# Patient Record
Sex: Male | Born: 1970 | Race: White | Hispanic: No | Marital: Married | Smoking: Former smoker
Health system: Southern US, Community
[De-identification: ages and names within clinical notes are randomized; demographics above are authoritative.]

## PROBLEM LIST (undated history)

## (undated) DIAGNOSIS — M1990 Unspecified osteoarthritis, unspecified site: Secondary | ICD-10-CM

## (undated) DIAGNOSIS — Z789 Other specified health status: Secondary | ICD-10-CM

## (undated) HISTORY — PX: PILONIDAL CYST EXCISION: SHX744

---

## 2000-03-28 HISTORY — PX: KNEE ARTHROSCOPY: SHX127

## 2000-10-25 ENCOUNTER — Ambulatory Visit (HOSPITAL_BASED_OUTPATIENT_CLINIC_OR_DEPARTMENT_OTHER): Admission: RE | Admit: 2000-10-25 | Discharge: 2000-10-25 | Payer: Self-pay | Admitting: Orthopedic Surgery

## 2002-03-28 HISTORY — PX: KNEE ARTHROSCOPY: SHX127

## 2005-03-28 HISTORY — PX: ARTHROSCOPIC REPAIR ACL: SUR80

## 2005-12-30 ENCOUNTER — Ambulatory Visit (HOSPITAL_BASED_OUTPATIENT_CLINIC_OR_DEPARTMENT_OTHER): Admission: RE | Admit: 2005-12-30 | Discharge: 2005-12-30 | Payer: Self-pay | Admitting: Orthopedic Surgery

## 2008-03-28 HISTORY — PX: KNEE ARTHROSCOPY W/ ACL RECONSTRUCTION: SHX1858

## 2008-04-23 ENCOUNTER — Ambulatory Visit (HOSPITAL_BASED_OUTPATIENT_CLINIC_OR_DEPARTMENT_OTHER): Admission: RE | Admit: 2008-04-23 | Discharge: 2008-04-23 | Payer: Self-pay | Admitting: Orthopedic Surgery

## 2010-07-12 LAB — POCT HEMOGLOBIN-HEMACUE: Hemoglobin: 15.5 g/dL (ref 13.0–17.0)

## 2010-08-10 NOTE — Op Note (Signed)
NAME:  Zachary Finley, Zachary Finley NO.:  [id number]   MEDICAL RECORD NO.:  [id number]          PATIENT TYPE:  AMB   LOCATION:  DSC                          FACILITY:  [facility]   PHYSICIAN:  Harvie Junior, M.D.   DATE OF BIRTH:  01/22/1971   DATE OF PROCEDURE:  04/23/2008  DATE OF DISCHARGE:                               OPERATIVE REPORT   PREOPERATIVE DIAGNOSES:  Anterior cruciate ligament tear, left with  chondromalacia of patella and suspected medial meniscal tear.   POSTOPERATIVE DIAGNOSES:  1. Anterior cruciate ligament tear.  2. Chondromalacia of patella, in particular patellofemoral trochlea.   PERTINENT PROCEDURE:  1. Anterior cruciate ligament reconstruction with central one-third      patellar tendon allograft.  2. Debridement of chondromalacia patella and patellofemoral trochlea      down to bleeding bone.   SURGEON:  Harvie Junior, MD   ASSISTANT:  Marshia Ly, PA   ANESTHESIA:  General.   BRIEF HISTORY:  Mr. Reihl is a 40 year old male with a long history of  having had a previous right ACL tear who had a twisting injury to his  left knee and unfortunately suffered a left ACL tear.  MRI showed that  he had a high grade ACL tear with suspected medial meniscal tear and a  questionable chondromalacia patella.  We talked about treatment options  and ultimately felt that the ACL reconstruction is the most appropriate  course of action.  He was brought to the operating room for this  procedure.   PROCEDURE:  The patient was brought to the operating room.  After  adequate anesthesia was obtained with general anesthetic, the patient  was placed supine on the operating table.  The left leg was prepped and  draped in usual sterile fashion.  Following this, the routine  arthroscopic examination of the knee revealed there was significant  chondromalacia of the patellofemoral trochlea.  This was debrided back  to a smooth and stable rim with a shaver.  We had to  do this from both  the medial and lateral compartment and this did go down to the bone.  The patella tracked midline and had no significant hyperpressure.  Attention was turned to the medial compartment.  The medial meniscus was  probed at length and felt to be stable.  Medial femoral condyle was  probed.  There was a little bit of softening of the cartilage but no  breaches in the cartilage, so we left this.  Attention was turned  lateral, the lateral femoral condyle and lateral meniscus were normal.  There was this big cyclops lesion laterally, which we debrided and the  ACL was actually oriented, but only probed back behind and they did not  go to the back wall at all.  We tensioned it, would not come under  tension.  At this point, the remaining stump of ACL was removed and the  notchplasty was performed.  Following this, attention was turned towards  putting the Arthrex femoral guide back hugging the PCL and we made a  small incision just distal to the  medial portal and then drilled a wire  into the central portion of this guide over-reamed to 10, 6.5 mm over  the top guide was then used in the femoral position and made a pilot  drill to Beath needle out the distal lateral femur, made a pilot hole  with a 9, made sure it was a hole and not a ditch out the back.  Once we  confirmed this, we drilled it to a level of 22 mm, which was the length  of the femoral side bone plug.  A notch was placed, removed all the bone  out of the knee at this point.  Bone fragments irrigated thoroughly.  We  did advance the graft into place, locked it inside on the femoral side  by 7 x 20 screw with a sheath to protect the graft, went to the tibial  side and locked in with a 7 x 25 screw.  No sheath, but got excellent  fixation, checked the graft at that point, perfect way that it kind of  came around the PCL and hugging the PCL and easy full extension.  We  looked at the patellofemoral trochlea, which  looked good.  No additional  areas of flaps or problems.  The medial side and lateral side were again  inspected to make sure that there was no loose pieces of fragment pieces  of bone.  At this point, the knee was copiously and thoroughly irrigated  and suctioned dry.  All sterile portals were closed with a bandage.  The  small incision for the ACL was closed with an 0 Vicryl interrupted and 3-  0 Monocryl subcuticular.  Benzoin and Steri-Strips were applied here.  A  sterile compressive dressing was applied as well as the knee  immobilizer.  The patient was taken to the recovery room and was noted  to be in satisfactory condition.  Estimated blood loss for the procedure  was none.      Harvie Junior, M.D.  Electronically Signed     JLG/MEDQ  D:  04/23/2008  T:  04/24/2008  Job:  [id number]

## 2010-08-13 NOTE — Op Note (Signed)
NAME:  Zachary Finley, Zachary Finley NO.:  [id number]   MEDICAL RECORD NO.:  [id number]          PATIENT TYPE:  AMB   LOCATION:  DSC                          FACILITY:  [facility]   PHYSICIAN:  Harvie Junior, M.D.   DATE OF BIRTH:  04-16-1970   DATE OF PROCEDURE:  12/30/2005  DATE OF DISCHARGE:                                 OPERATIVE REPORT   PREOPERATIVE DIAGNOSES:  1. Anterior cruciate ligament tear.  2. Posterior-horn medial meniscal tear.   POSTOPERATIVE DIAGNOSES:  1. Anterior cruciate ligament tear.  2. Posterior-horn medial meniscal tear.   PRINCIPLE PROCEDURES:  1. Anterior cruciate ligament reconstruction with central 1/3 patellar      tendon allograft.  2. Debridement of posterior-horn medial meniscus bucket-handle style tear.   SURGEON:  Harvie Junior, M.D.   ASSISTANT:  Marshia Ly, P.A.   ANESTHESIA:  General.   BRIEF HISTORY:  The patient is a 40 year old male with a long history of  having a twisting-type injury to his knee.  He ultimately began feeling  unstable once this had happened, and MRI was obtained, which showed that he  had an ACL tear, as well as a posterior horn medial meniscal tear.  We were  consulted for his treatment.  We talked about treatment options and we  ultimately felt that reconstruction was the most appropriate course of  action, given the unstable feeling that he was having.  At this point, the  patient was taken to the operating room for fixation of these problems.   PROCEDURES:  The patient was taken to the operating room, and after adequate  anesthesia was obtained with a general anesthetic, the patient was placed on  the operating table and the right leg was then prepped and draped in the  usual sterile fashion.  The knee was examined under anesthesia prior to  prepping and draping, and at this point, it was obviously Lachman positive  and obviously pivot positive.  At that point, the leg was prepped and draped  in the  usual sterile fashion and placed into a leg holder.  At that point,  the arthroscope examination revealed that there was lateral tracking of the  patella, but certainly not dramatic, and it did recover as we came into  about 60 degrees of flexion.  We came into the medial side.  There was a big  wad of medial scar tissue, which was identified, and this was debrided.  Attention turned to the medial compartment, where the medial meniscus had a  complex posterior-horn tear with an anterior flap and a posterior flap,  which had balled up back in the posteromedial area, which was seen very well  by MRI.  This was debrided with a straight-biting forceps, an upbiting  forceps, and the remaining meniscal rim was contoured down with a suction  shaver.  A probe was used to make sure the meniscus was stable at this  point.  Attention at this time was turned into the notch, where,  interestingly, the anterior cruciate ligament looked to be oriented and  going in the appropriate  direction.  With a probe and no other teasing, it  was clear that the anterior cruciate did not go anywhere near its insertion  site and the lateral wall was completely empty.  At this point, the remnant  of the anterior cruciate was debrided and this was removed.  A fairly  aggressive notchplasty was undertaken to dig back out the area, where the  ACL had been torn off.  Once this was accomplished, the attention was turned  towards the ACL reconstruction.  The guide was placed just 7 mm anterior to  the posterior cruciate ligament, and the small incision was made just  inferior to the medial portal.  A guide wire was then placed into the full  footprint of the ACL, and this was overreamed with a 10-mm reamer.  The back  portion of the tunnel was then rasped, and a 6.5-mm over-the-top guide was  used and a beef needle was advanced out the distal lateral femur.  At this  point, this hole was overreamed with a 9 reamer.  Initially,  a footprint was  made to make sure there was an adequate back wall; and seeing that,  attention was then turned to reaming deeper to a level of 25 mm.  At this  point, the reamer was removed and the tunnel was notched for a screw.  At  this point, all excess bone fragments and remnants were removed from the  knee by milking the knee out the tibial hole, as well as the suction shaver.  At this point, the graft was advanced in the knee, locked in place, locked  on the femoral side with a 7 x 20 screw.  At this point, attention was  turned towards the tibial side, and the knee was cycled through 20 ranges  with no tendency towards instability of the graft, and the tibial side was  then locked in place with a 7 x 20 screw with no sheath.  Excellent fixation  was achieved.  There was a couple millimeters of bone sticking out of the  tunnel distally, and this was nibbled with a rongeur.  Once this was  completed, attention was turned towards checking the graft in the knee.  It  felt great, and in extension, the knee easily came out into full extension.  No block to full extension, and once this was checked, the leg could easily  be put into full flexion.  No medial or lateral instability.  We took a look  medially and laterally.  There were no bone fragments or fragmenting pieces  of meniscus or cartilage.  At this point, the knee was copiously irrigated  and suctioned dry.  The portals were closed with a bandage.  A small  incision was made for the tibial tunnel.  It was closed with some 2-0 Vicryl  and a 3-0 Maxon pull-out suture.  Benzoin and Steri-Strips were applied.  The patient was taken to the recovery room, where he was noted to be in  satisfactory condition.  Estimated blood loss for the procedure was  negligible.      Harvie Junior, M.D.  Electronically Signed     JLG/MEDQ  D:  12/30/2005  T:  12/31/2005  Job:  [id number]

## 2010-08-13 NOTE — Op Note (Signed)
[place]  Patient:    LONNY, EISEN                      MRN: [id number] Proc. Date: 10/25/00 Adm. Date:  [id number] Attending:  Milly Jakob                           Operative Report  PREOPERATIVE DIAGNOSIS:  Knee pain suspected plica versus meniscal tear.  POSTOPERATIVE DIAGNOSIS:  Medial shelf plica.  PROCEDURE:  Debridement of medial shelf plica.  SURGEON:  Harvie Junior, M.D.  ANESTHESIA:  General.  BRIEF HISTORY:  This is a 40 year old male with a long history of having left knee pain.  He has had multiple episodes of popping and catching in the knee. He had been evaluated in the office and had undergone conservative care and had failed this and because of failure of conservative care and because of continue catching and locking of the knee the patient was taken to the operating room for evaluation, anesthesia and arthroscopy.  PROCEDURE:  The patient was taken to the operating room and after adequate anesthesia was obtained with general anesthetic the patient was placed supine on the operating room table.  The left leg was then prepped and draped in the usual sterile fashion.  Following this routine arthroscopic examination revealed there was some lateral patellar tilt although the patella did centralize and the knee came down to full flexion.  The cannula could be easily moved under the patella.  Attention was then turned to the medial side where there was noted to be a fairly dramatic medial shelf plica which blocked entrance into the medial compartment.  This was popping back and forth over the medial femoral condyle and there was an area of roughness over the medial femoral condyle.  The medial shelf plica was then debrided to allow access into the medial compartment.  This was a fairly significant procedure as the plica was quite tenacious, and multiple biters and shavers had to be used to remove the medial shelf  plica.  Attention was then turned to the medial compartment where there was a ______ normalcy in the medial meniscus and no evidence of instability of that meniscus.  Attention was then turned to the medial femoral condyle where there was no evidence of chondromalacia.  Attention was then turned to anterior cruciate which normal lateral side was normal.  I returned back to the patellofemoral joint which showed no evidence of chondromalacia.  A final check was made and it was now easy to come into the medial compartment with no evidence of soft tissue impinging on the medial femoral condyle.  At this time the knee was copiously irrigated and suctioned dry.  The arthroscopic portals were closed with bandage, 20 cc. of 0.25% Marcaine was instilled in the knee for postoperative anesthesia.  Sterile compressive dressing was applied and the patient was taken to the recovery room where she was noted to be in satisfactory condition. DD:  10/25/00 TD:  10/25/00 Job: [id number]

## 2011-11-10 ENCOUNTER — Other Ambulatory Visit: Payer: Self-pay | Admitting: Orthopedic Surgery

## 2011-11-25 ENCOUNTER — Ambulatory Visit (HOSPITAL_BASED_OUTPATIENT_CLINIC_OR_DEPARTMENT_OTHER)
Admission: RE | Admit: 2011-11-25 | Payer: BC Managed Care – PPO | Source: Ambulatory Visit | Admitting: Orthopedic Surgery

## 2011-11-25 ENCOUNTER — Encounter (HOSPITAL_BASED_OUTPATIENT_CLINIC_OR_DEPARTMENT_OTHER): Admission: RE | Payer: Self-pay | Source: Ambulatory Visit

## 2011-11-25 SURGERY — KNEE ARTHROSCOPY WITH ANTERIOR CRUCIATE LIGAMENT (ACL) REPAIR
Anesthesia: General | Laterality: Right

## 2011-12-06 ENCOUNTER — Encounter (HOSPITAL_BASED_OUTPATIENT_CLINIC_OR_DEPARTMENT_OTHER): Payer: Self-pay | Admitting: *Deleted

## 2011-12-06 NOTE — Progress Notes (Signed)
This will be his 5th knee surgery To bring crutches No labs needed

## 2011-12-09 ENCOUNTER — Ambulatory Visit (HOSPITAL_BASED_OUTPATIENT_CLINIC_OR_DEPARTMENT_OTHER)
Admission: RE | Admit: 2011-12-09 | Discharge: 2011-12-09 | Disposition: A | Discharge status: Home / Self Care | Payer: BC Managed Care – PPO | Source: Ambulatory Visit | Attending: Orthopedic Surgery | Admitting: Orthopedic Surgery

## 2011-12-09 ENCOUNTER — Encounter (HOSPITAL_BASED_OUTPATIENT_CLINIC_OR_DEPARTMENT_OTHER): Payer: Self-pay | Admitting: *Deleted

## 2011-12-09 ENCOUNTER — Encounter (HOSPITAL_BASED_OUTPATIENT_CLINIC_OR_DEPARTMENT_OTHER): Payer: Self-pay | Admitting: Certified Registered"

## 2011-12-09 ENCOUNTER — Ambulatory Visit (HOSPITAL_BASED_OUTPATIENT_CLINIC_OR_DEPARTMENT_OTHER): Payer: BC Managed Care – PPO | Admitting: Anesthesiology

## 2011-12-09 ENCOUNTER — Encounter (HOSPITAL_BASED_OUTPATIENT_CLINIC_OR_DEPARTMENT_OTHER): Payer: Self-pay | Admitting: Anesthesiology

## 2011-12-09 ENCOUNTER — Encounter (HOSPITAL_BASED_OUTPATIENT_CLINIC_OR_DEPARTMENT_OTHER)
Admission: RE | Disposition: A | Discharge status: Home / Self Care | Payer: Self-pay | Source: Ambulatory Visit | Attending: Orthopedic Surgery

## 2011-12-09 DIAGNOSIS — S83509A Sprain of unspecified cruciate ligament of unspecified knee, initial encounter: Secondary | ICD-10-CM | POA: Insufficient documentation

## 2011-12-09 DIAGNOSIS — M2340 Loose body in knee, unspecified knee: Secondary | ICD-10-CM | POA: Insufficient documentation

## 2011-12-09 DIAGNOSIS — X500XXA Overexertion from strenuous movement or load, initial encounter: Secondary | ICD-10-CM | POA: Insufficient documentation

## 2011-12-09 DIAGNOSIS — M1990 Unspecified osteoarthritis, unspecified site: Secondary | ICD-10-CM | POA: Insufficient documentation

## 2011-12-09 DIAGNOSIS — IMO0002 Reserved for concepts with insufficient information to code with codable children: Secondary | ICD-10-CM | POA: Insufficient documentation

## 2011-12-09 DIAGNOSIS — Z472 Encounter for removal of internal fixation device: Secondary | ICD-10-CM | POA: Insufficient documentation

## 2011-12-09 DIAGNOSIS — M2240 Chondromalacia patellae, unspecified knee: Secondary | ICD-10-CM | POA: Insufficient documentation

## 2011-12-09 DIAGNOSIS — M2350 Chronic instability of knee, unspecified knee: Secondary | ICD-10-CM | POA: Insufficient documentation

## 2011-12-09 HISTORY — DX: Other specified health status: Z78.9

## 2011-12-09 HISTORY — DX: Unspecified osteoarthritis, unspecified site: M19.90

## 2011-12-09 SURGERY — KNEE ARTHROSCOPY WITH ANTERIOR CRUCIATE LIGAMENT (ACL) REPAIR
Anesthesia: General | Site: Knee | Laterality: Right | Wound class: Clean

## 2011-12-09 MED ORDER — CEFAZOLIN SODIUM-DEXTROSE 2-3 GM-% IV SOLR
2.0000 g | Freq: Once | INTRAVENOUS | Status: AC
  Administered 2011-12-09: 2 g via INTRAVENOUS

## 2011-12-09 MED ORDER — MIDAZOLAM HCL 2 MG/2ML IJ SOLN
0.5000 mg | INTRAMUSCULAR | Status: DC | PRN
  Administered 2011-12-09: 2 mg via INTRAVENOUS

## 2011-12-09 MED ORDER — POVIDONE-IODINE 7.5 % EX SOLN
Freq: Once | CUTANEOUS | Status: AC
  Administered 2011-12-09: 12:00:00 via TOPICAL

## 2011-12-09 MED ORDER — FENTANYL CITRATE 0.05 MG/ML IJ SOLN
50.0000 ug | INTRAMUSCULAR | Status: DC | PRN
  Administered 2011-12-09: 100 ug via INTRAVENOUS

## 2011-12-09 MED ORDER — BUPIVACAINE-EPINEPHRINE PF 0.5-1:200000 % IJ SOLN
INTRAMUSCULAR | Status: DC | PRN
  Administered 2011-12-09: 30 mL

## 2011-12-09 MED ORDER — FENTANYL CITRATE 0.05 MG/ML IJ SOLN
INTRAMUSCULAR | Status: DC | PRN
  Administered 2011-12-09 (×4): 25 ug via INTRAVENOUS

## 2011-12-09 MED ORDER — LACTATED RINGERS IV SOLN
INTRAVENOUS | Status: DC
  Administered 2011-12-09 (×2): via INTRAVENOUS

## 2011-12-09 MED ORDER — DEXTROSE 5 % IV SOLN
3.0000 g | INTRAVENOUS | Status: AC
  Administered 2011-12-09: 3 g via INTRAVENOUS

## 2011-12-09 MED ORDER — OXYCODONE HCL 5 MG/5ML PO SOLN: 5.0000 mg | Freq: Once | ORAL | Status: AC | PRN

## 2011-12-09 MED ORDER — ACETAMINOPHEN 10 MG/ML IV SOLN
1000.0000 mg | Freq: Once | INTRAVENOUS | Status: AC
  Administered 2011-12-09: 1000 mg via INTRAVENOUS

## 2011-12-09 MED ORDER — METOCLOPRAMIDE HCL 5 MG/ML IJ SOLN
INTRAMUSCULAR | Status: DC | PRN
  Administered 2011-12-09: 10 mg via INTRAVENOUS

## 2011-12-09 MED ORDER — LIDOCAINE HCL (CARDIAC) 20 MG/ML IV SOLN
INTRAVENOUS | Status: DC | PRN
  Administered 2011-12-09: 50 mg via INTRAVENOUS

## 2011-12-09 MED ORDER — CEFAZOLIN SODIUM-DEXTROSE 2-3 GM-% IV SOLR: 2.0000 g | Freq: Once | INTRAVENOUS | Status: DC

## 2011-12-09 MED ORDER — OXYCODONE-ACETAMINOPHEN 5-325 MG PO TABS: 1.0000 | ORAL_TABLET | Freq: Four times a day (QID) | ORAL | Status: AC | PRN

## 2011-12-09 MED ORDER — OXYCODONE HCL 5 MG PO TABS
5.0000 mg | ORAL_TABLET | Freq: Once | ORAL | Status: AC | PRN
  Administered 2011-12-09: 5 mg via ORAL

## 2011-12-09 MED ORDER — DEXAMETHASONE SODIUM PHOSPHATE 4 MG/ML IJ SOLN
INTRAMUSCULAR | Status: DC | PRN
  Administered 2011-12-09: 10 mg via INTRAVENOUS

## 2011-12-09 MED ORDER — ONDANSETRON HCL 4 MG/2ML IJ SOLN
INTRAMUSCULAR | Status: DC | PRN
  Administered 2011-12-09: 4 mg via INTRAVENOUS

## 2011-12-09 MED ORDER — PROPOFOL 10 MG/ML IV BOLUS
INTRAVENOUS | Status: DC | PRN
  Administered 2011-12-09: 200 mg via INTRAVENOUS

## 2011-12-09 MED ORDER — HYDROMORPHONE HCL PF 1 MG/ML IJ SOLN
0.2500 mg | INTRAMUSCULAR | Status: DC | PRN
  Administered 2011-12-09 (×4): 0.5 mg via INTRAVENOUS

## 2011-12-09 MED ORDER — CLINDAMYCIN PHOSPHATE 600 MG/50ML IV SOLN: 600.0000 mg | Freq: Once | INTRAVENOUS | Status: DC

## 2011-12-09 SURGICAL SUPPLY — 78 items
APL SKNCLS STERI-STRIP NONHPOA (GAUZE/BANDAGES/DRESSINGS) ×1
BANDAGE ESMARK 6X9 LF (GAUZE/BANDAGES/DRESSINGS) IMPLANT
BENZOIN TINCTURE PRP APPL 2/3 (GAUZE/BANDAGES/DRESSINGS) ×2 IMPLANT
BLADE 4.2CUDA (BLADE) IMPLANT
BLADE AVERAGE 25X9 (BLADE) ×2 IMPLANT
BLADE CUDA 5.5 (BLADE) IMPLANT
BLADE CUTTER GATOR 3.5 (BLADE) IMPLANT
BLADE GREAT WHITE 4.2 (BLADE) ×2 IMPLANT
BLADE OSCIL/SAGITTAL W/10 ST (BLADE) IMPLANT
BLADE SURG 15 STRL LF DISP TIS (BLADE) ×1 IMPLANT
BLADE SURG 15 STRL SS (BLADE) ×2
BNDG CMPR 9X6 STRL LF SNTH (GAUZE/BANDAGES/DRESSINGS) ×1
BNDG ESMARK 6X9 LF (GAUZE/BANDAGES/DRESSINGS) ×2
BUR OVAL 4.0 (BURR) ×2 IMPLANT
CANISTER OMNI JUG 16 LITER (MISCELLANEOUS) ×2 IMPLANT
CANISTER SUCTION 2500CC (MISCELLANEOUS) IMPLANT
CLOTH BEACON ORANGE TIMEOUT ST (SAFETY) ×2 IMPLANT
COVER TABLE BACK 60X90 (DRAPES) ×2 IMPLANT
DRAPE ARTHROSCOPY W/POUCH 114 (DRAPES) ×2 IMPLANT
DRSG EMULSION OIL 3X3 NADH (GAUZE/BANDAGES/DRESSINGS) ×2 IMPLANT
DURAPREP 26ML APPLICATOR (WOUND CARE) ×2 IMPLANT
ELECT MENISCUS 165MM 90D (ELECTRODE) IMPLANT
ELECT REM PT RETURN 9FT ADLT (ELECTROSURGICAL) ×2
ELECTRODE REM PT RTRN 9FT ADLT (ELECTROSURGICAL) IMPLANT
GLOVE BIO SURGEON STRL SZ 6.5 (GLOVE) ×1 IMPLANT
GLOVE BIOGEL PI IND STRL 7.0 (GLOVE) IMPLANT
GLOVE BIOGEL PI IND STRL 8 (GLOVE) ×2 IMPLANT
GLOVE BIOGEL PI INDICATOR 7.0 (GLOVE) ×1
GLOVE BIOGEL PI INDICATOR 8 (GLOVE) ×2
GLOVE ECLIPSE 7.5 STRL STRAW (GLOVE) ×4 IMPLANT
GOWN BRE IMP PREV XXLGXLNG (GOWN DISPOSABLE) ×2 IMPLANT
GOWN PREVENTION PLUS XLARGE (GOWN DISPOSABLE) ×2 IMPLANT
GOWN PREVENTION PLUS XXLARGE (GOWN DISPOSABLE) ×2 IMPLANT
GRAFT TISS PATELLAR TNDN 10 (Tissue) IMPLANT
HOLDER KNEE FOAM BLUE (MISCELLANEOUS) ×2 IMPLANT
IMMOBILIZER KNEE 22 UNIV (SOFTGOODS) IMPLANT
IMMOBILIZER KNEE 24 THIGH 36 (MISCELLANEOUS) IMPLANT
IMMOBILIZER KNEE 24 UNIV (MISCELLANEOUS) ×2
KIT TRANSTIBIAL (DISPOSABLE) ×2 IMPLANT
KNEE WRAP E Z 3 GEL PACK (MISCELLANEOUS) ×2 IMPLANT
KNIFE GRAFT ACL 10MM 5952 (MISCELLANEOUS) IMPLANT
KNIFE GRAFT ACL 11MM (MISCELLANEOUS) IMPLANT
NDL FILTER BLUNT 18X1 1/2 (NEEDLE) ×1 IMPLANT
NDL SAFETY ECLIPSE 18X1.5 (NEEDLE) ×1 IMPLANT
NEEDLE FILTER BLUNT 18X 1/2SAF (NEEDLE)
NEEDLE FILTER BLUNT 18X1 1/2 (NEEDLE) IMPLANT
NEEDLE HYPO 18GX1.5 SHARP (NEEDLE)
NEEDLE MENISCAL REPAIR DBL ARM (NEEDLE) IMPLANT
PACK ARTHROSCOPY DSU (CUSTOM PROCEDURE TRAY) ×2 IMPLANT
PACK BASIN DAY SURGERY FS (CUSTOM PROCEDURE TRAY) ×2 IMPLANT
PAD CAST 4YDX4 CTTN HI CHSV (CAST SUPPLIES) ×2 IMPLANT
PADDING CAST COTTON 4X4 STRL (CAST SUPPLIES) ×4
PASSER SUT SWANSON 36MM LOOP (INSTRUMENTS) IMPLANT
PATELLA LIGAMENT BISECTED FR (Tissue) ×2 IMPLANT
PENCIL BUTTON HOLSTER BLD 10FT (ELECTRODE) ×1 IMPLANT
SCREW INTERFERENCE 7X20MM (Screw) ×1 IMPLANT
SCREW SHEATHED INTERF 8X20MM (Screw) ×1 IMPLANT
SET ARTHROSCOPY TUBING (MISCELLANEOUS) ×2
SET ARTHROSCOPY TUBING LN (MISCELLANEOUS) ×1 IMPLANT
SHEET MEDIUM DRAPE 40X70 STRL (DRAPES) ×2 IMPLANT
SPONGE GAUZE 4X4 12PLY (GAUZE/BANDAGES/DRESSINGS) ×2 IMPLANT
SPONGE LAP 4X18 X RAY DECT (DISPOSABLE) ×2 IMPLANT
STRIP CLOSURE SKIN 1/2X4 (GAUZE/BANDAGES/DRESSINGS) ×2 IMPLANT
SUCTION FRAZIER TIP 10 FR DISP (SUCTIONS) ×2 IMPLANT
SUT ETHILON 4 0 PS 2 18 (SUTURE) IMPLANT
SUT MNCRL AB 3-0 PS2 18 (SUTURE) ×2 IMPLANT
SUT PDS AB 1 CT  36 (SUTURE) ×2
SUT PDS AB 1 CT 36 (SUTURE) ×2 IMPLANT
SUT STEEL 5 (SUTURE) ×2 IMPLANT
SUT TICRON 1 T 12 (SUTURE) IMPLANT
SUT VIC AB 0 CT1 27 (SUTURE)
SUT VIC AB 0 CT1 27XBRD ANBCTR (SUTURE) IMPLANT
SUT VIC AB 2-0 SH 27 (SUTURE)
SUT VIC AB 2-0 SH 27XBRD (SUTURE) IMPLANT
SYR 5ML LL (SYRINGE) ×2 IMPLANT
TOWEL OR 17X24 6PK STRL BLUE (TOWEL DISPOSABLE) ×6 IMPLANT
TOWEL OR NON WOVEN STRL DISP B (DISPOSABLE) ×2 IMPLANT
WATER STERILE IRR 1000ML POUR (IV SOLUTION) ×2 IMPLANT

## 2011-12-09 NOTE — Brief Op Note (Signed)
12/09/2011  2:57 PM  PATIENT:  Zachary Finley.  41 y.o. male  PRE-OPERATIVE DIAGNOSIS:  right knee anterior cruciate ligament tear with medial meniscus tear  POST-OPERATIVE DIAGNOSIS:  * No post-op diagnosis entered *  PROCEDURE:  Procedure(s) (LRB) with comments: KNEE ARTHROSCOPY WITH ANTERIOR CRUCIATE LIGAMENT (ACL) REPAIR (Right) - right knee scope with anterior cruciate ligament revision with allograft.  SURGEON:  Surgeon(s) and Role:    * Harvie Junior, MD - Primary  PHYSICIAN ASSISTANT:   ASSISTANTS: bethune   ANESTHESIA:   general  EBL:  Total I/O In: 1000 [I.V.:1000] Out: -   BLOOD ADMINISTERED:none  DRAINS: none   LOCAL MEDICATIONS USED:  MARCAINE     SPECIMEN:  No Specimen  DISPOSITION OF SPECIMEN:  N/A  COUNTS:  YES  TOURNIQUET:  * Missing tourniquet times found for documented tourniquets in log:  [id number] *  DICTATION: .Other Dictation: Dictation Number [id number]  PLAN OF CARE: Discharge to home after PACU  PATIENT DISPOSITION:  PACU - hemodynamically stable.   Delay start of Pharmacological VTE agent (>24hrs) due to surgical blood loss or risk of bleeding: not applicable

## 2011-12-09 NOTE — H&P (Signed)
PREOPERATIVE H&P  Chief Complaint: r knee insatbility and pain  HPI: Zachary Finley. is a 41 y.o. male who presents for evaluation of r knee instability and pain. It has been present for greater than 6 mon and has been worsening.  Pt has had prev ACL many years ago and has had recent injury with recurrent instability and pain. He has failed conservative measures. Pain is rated as moderate.  Past Medical History  Diagnosis Date  . DJD (degenerative joint disease)   . No pertinent past medical history    Past Surgical History  Procedure Date  . Arthroscopic repair acl 2007    right  . Knee arthroscopy w/ acl reconstruction 2010    left  . Knee arthroscopy 2002    left  . Pilonidal cyst excision     as child  . Knee arthroscopy 2004    right   History   Social History  . Marital Status: Married    Spouse Name: N/A    Number of Children: N/A  . Years of Education: N/A   Social History Main Topics  . Smoking status: Former Smoker    Quit date: 12/06/2006  . Smokeless tobacco: None  . Alcohol Use: Yes  . Drug Use:   . Sexually Active:    Other Topics Concern  . None   Social History Narrative  . None   History reviewed. No pertinent family history. Allergies  Allergen Reactions  . Augmentin (Amoxicillin-Pot Clavulanate) Hives  . Penicillins Hives   Prior to Admission medications   Medication Sig Start Date End Date Taking? Authorizing Provider  acetaminophen (TYLENOL) 500 MG tablet Take 500 mg by mouth every 6 (six) hours as needed.   Yes Historical Provider, MD  naproxen sodium (ANAPROX) 220 MG tablet Take 220 mg by mouth as needed.   Yes Historical Provider, MD     Positive ROS: none  All other systems have been reviewed and were otherwise negative with the exception of those mentioned in the HPI and as above.  Physical Exam: Filed Vitals:   12/09/11 1206  BP: 147/99  Pulse: 64  Temp: 98.5 F (36.9 C)  Resp: 18    General: Alert, no acute  distress Cardiovascular: No pedal edema Respiratory: No cyanosis, no use of accessory musculature GI: No organomegaly, abdomen is soft and non-tender Skin: No lesions in the area of chief complaint Neurologic: Sensation intact distally Psychiatric: Patient is competent for consent with normal mood and affect Lymphatic: No axillary or cervical lymphadenopathy  MUSCULOSKELETAL: R knee instability ant and  MRI+ acl tear Assessment/Plan: right knee acl with medial miniscus tear Plan for Procedure(s): KNEE ARTHROSCOPY WITH ANTERIOR CRUCIATE LIGAMENT (ACL) REPAIR and partial med meniscectomy  The risks benefits and alternatives were discussed with the patient including but not limited to the risks of nonoperative treatment, versus surgical intervention including infection, bleeding, nerve injury, malunion, nonunion, hardware prominence, hardware failure, need for hardware removal, blood clots, cardiopulmonary complications, morbidity, mortality, among others, and they were willing to proceed.  Predicted outcome is good, although there will be at least a six to nine month expected recovery.  Harvie Junior, MD 12/09/2011 12:23 PM

## 2011-12-09 NOTE — Transfer of Care (Signed)
Immediate Anesthesia Transfer of Care Note  Patient: Zachary Finley.  Procedure(s) Performed: Procedure(s) (LRB) with comments: KNEE ARTHROSCOPY WITH ANTERIOR CRUCIATE LIGAMENT (ACL) REPAIR (Right) - right knee scope with anterior cruciate ligament revision with allograft.  Patient Location: PACU  Anesthesia Type: GA combined with regional for post-op pain  Level of Consciousness: awake, alert , oriented and patient cooperative  Airway & Oxygen Therapy: Patient Spontanous Breathing and Patient connected to face mask oxygen  Post-op Assessment: Report given to PACU RN and Post -op Vital signs reviewed and stable  Post vital signs: Reviewed and stable  Complications: No apparent anesthesia complications

## 2011-12-09 NOTE — Anesthesia Postprocedure Evaluation (Signed)
  Anesthesia Post-op Note  Patient: Zachary Finley.  Procedure(s) Performed: Procedure(s) (LRB) with comments: KNEE ARTHROSCOPY WITH ANTERIOR CRUCIATE LIGAMENT (ACL) REPAIR (Right) - right knee scope with anterior cruciate ligament revision with allograft.  Patient Location: PACU  Anesthesia Type: GA combined with regional for post-op pain  Level of Consciousness: awake, alert  and oriented  Airway and Oxygen Therapy: Patient Spontanous Breathing  Post-op Pain: mild  Post-op Assessment: Post-op Vital signs reviewed, Patient's Cardiovascular Status Stable, Respiratory Function Stable, Patent Airway and No signs of Nausea or vomiting  Post-op Vital Signs: Reviewed and stable  Complications: No apparent anesthesia complications

## 2011-12-09 NOTE — Anesthesia Procedure Notes (Addendum)
Anesthesia Regional Block:  Femoral nerve block  Pre-Anesthetic Checklist: ,, timeout performed, Correct Patient, Correct Site, Correct Laterality, Correct Procedure, Correct Position, site marked, Risks and benefits discussed, pre-op evaluation,  At surgeon's request and post-op pain management  Laterality: Right  Prep: Maximum Sterile Barrier Precautions used and chloraprep       Needles:  Injection technique: Single-shot  Needle Type: Echogenic Stimulator Needle      Needle Gauge: 22 and 22 G    Additional Needles:  Procedures: ultrasound guided and nerve stimulator Femoral nerve block  Nerve Stimulator or Paresthesia:  Response: Patellar respose, 0.4 mA,   Additional Responses:   Narrative:  Start time: 12/09/2011 12:22 PM End time: 12/09/2011 12:30 PM Injection made incrementally with aspirations every 5 mL. Anesthesiologist: Fitzgerald,MD  Additional Notes: 2% Lidocaine skin wheel.   Femoral nerve block Procedure Name: LMA Insertion Date/Time: 12/09/2011 12:48 PM Performed by: Eduard Clos EDMOND Pre-anesthesia Checklist: Patient identified, Emergency Drugs available, Suction available, Patient being monitored and Timeout performed Patient Re-evaluated:Patient Re-evaluated prior to inductionOxygen Delivery Method: Circle system utilized Preoxygenation: Pre-oxygenation with 100% oxygen Intubation Type: IV induction Ventilation: Mask ventilation without difficulty LMA: LMA inserted LMA Size: 5.0 Dental Injury: Teeth and Oropharynx as per pre-operative assessment

## 2011-12-09 NOTE — Progress Notes (Signed)
Assisted Dr. Fitzgerald with right, ultrasound guided, femoral block. Side rails up, monitors on throughout procedure. See vital signs in flow sheet. Tolerated Procedure well. 

## 2011-12-09 NOTE — Anesthesia Preprocedure Evaluation (Addendum)
Anesthesia Evaluation  Patient identified by MRN, date of birth, ID band Patient awake    Reviewed: Allergy & Precautions, H&P , NPO status , Patient's Chart, lab work & pertinent test results  Airway Mallampati: II TM Distance: >3 FB Neck ROM: Full    Dental No notable dental hx. (+) Teeth Intact and Dental Advisory Given   Pulmonary neg pulmonary ROS,  breath sounds clear to auscultation  Pulmonary exam normal       Cardiovascular negative cardio ROS  Rhythm:Regular Rate:Normal     Neuro/Psych negative neurological ROS  negative psych ROS   GI/Hepatic negative GI ROS, Neg liver ROS,   Endo/Other  negative endocrine ROS  Renal/GU negative Renal ROS  negative genitourinary   Musculoskeletal   Abdominal   Peds  Hematology negative hematology ROS (+)   Anesthesia Other Findings   Reproductive/Obstetrics negative OB ROS                           Anesthesia Physical Anesthesia Plan  ASA: II  Anesthesia Plan: General   Post-op Pain Management:    Induction: Intravenous  Airway Management Planned: LMA  Additional Equipment:   Intra-op Plan:   Post-operative Plan: Extubation in OR  Informed Consent: I have reviewed the patients History and Physical, chart, labs and discussed the procedure including the risks, benefits and alternatives for the proposed anesthesia with the patient or authorized representative who has indicated his/her understanding and acceptance.   Dental advisory given  Plan Discussed with: CRNA and Surgeon  Anesthesia Plan Comments:         Anesthesia Quick Evaluation  

## 2011-12-12 LAB — POCT HEMOGLOBIN-HEMACUE: Hemoglobin: 15.2 g/dL (ref 13.0–17.0)

## 2011-12-12 NOTE — Op Note (Signed)
NAME:  Zachary Finley, WIECZOREK NO.:  [id number]  MEDICAL RECORD NO.:  [id number]  LOCATION:                                 FACILITY:  PHYSICIAN:  Harvie Junior, M.D.   DATE OF BIRTH:  [birth year]  DATE OF PROCEDURE:  12/09/2011 DATE OF DISCHARGE:                              OPERATIVE REPORT   PREOPERATIVE DIAGNOSES: 1. 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 2. Posterior horn, medial meniscal tear.  POSTOPERATIVE DIAGNOSES: 1. 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 2. Posterior horn, medial meniscal tear. 3. Multiple bony loose bodies within the knee.  PRINCIPAL PROCEDURE: 1. Anterior cruciate ligament reconstruction revision with central one-     third patellar tendon allograft. 2. Posterior horn, medial meniscal resection. 3. Removal of multiple bony loose bodies from within the knee. 4. Removal of femoral screw.  SURGEON:  Harvie Junior, MD  ASSISTANT:  Marshia Ly, PA  ANESTHESIA:  General.  BRIEF HISTORY:  Mr. Middlesworth is a 41 year old male with history of having an ACL reconstruction 7 years ago.  He had done well for a period of time and had a sudden injury where he felt like his knee gave out and was evaluated and noted to have recurrent instability.  MRI was obtained, which showed ACL tear and posterior horn meniscal tear.  We had a long talk about treatment options but felt given his young age and active lifestyle that revision surgery was appropriate.  He was brought to the operating room for this procedure.  Significant preoperative planning had been undertaken relative to issues related to screws and other potential problems which may arise.  PROCEDURE:  The patient was taken to the operating room and after adequate level of anesthesia was obtained with general anesthetic, the patient was placed supine  on the operating table.  The right leg was then prepped and draped in usual sterile fashion.  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  This was evaluated and attention was turned to the medial compartment.  There was a large posterior horn medial meniscal tear with a flap posteriorly. This was debrided with a combination of straight biting forceps, up- biting forceps, and the remaining meniscal rim was contoured down with the suction shaver.  Grade 2 changes of medial femoral condyle was identified.  At this time, attention was turned towards the notch where the ACL was torn and flaps were flapped anteriorly.  There was large bony prominence anteriorly, uncertain of the donor of this but certainly it was a significant bony prominence.  I used a shaver for a pretty long time, trying to get this area free and with a grasper, broke a pituitary grasper not inside the joint but trying to remove this, broke a regular grasper trying to remove it.  Ultimately we were able to get a Kocher on it and were able to pull out this loose body.  There was a couple of other loose bodies that we encountered posteriorly in the  knee as well. Once these multiple loose bodies had been encountered, a notchplasty was performed.  At this time, the femoral screw was not visible and so we had to make a decision about where we are going to go at this point.  We ultimately elected to go ahead with the surgical procedure and we advanced by placing a guidewire into the appropriate position of the old ACL stump and over reamed this with a 10.  We happily were able to sneak by the old tibial screw, put it over the top guide, and were trying to sneak by the femoral screw but we went to drill, uncovered the femoral screw.  At this time, we had to stop, curetted out the area of the femoral screw and ultimately were able  to get a guidewire in the screw and then back out the screw and pulled it through the medial portal which we extended to pull this screw.  At this point, we advanced the guidewire out the distal lateral femur and then over-reamed this with a 9 and passed the graft at this point. Once we did that, we put a guidewire, care being taken to keep it anterior to the graft and then a 7 x 20 screw with a sheath to protect the graft, was put in place and this gave excellent fixation on the femoral side.  Attention was then turned to the tibial side where guidewire was placed and a 7 x 20 screw with no sheath was advanced holding the tibial graft in place.  We did not use fluoro or need to localize it because we knew we had drilled a tunnel.  We knew that we had a screw that was holding the tunnel in place and there was excellent bone squeak, so we knew it was a good tight screw.  At this point, prior to placement of the tibial screw, the knee was cycled 20 times.  There was no isometry of the graft.  Once this was done, the graft was evaluated in flexion extension and noted to have perfect position and stability.  Attention turned back to medial and lateral, looking for any loose or formed pieces, none were seen.  At this time, the knee was copiously and thoroughly lavaged and suctioned dry and the arthroscopic portals were closed with nylon interrupted and the distal incision was closed with subcuticular and Monocryl.  The tourniquet was let up during the case because of some bleeding from the bone and we knew we had lot of issues to deal with the old screws at 1 hour and 15 minutes of tourniquet time.  The patient will be taken to the recovery room where he was noted to be in satisfactory condition.  Estimated blood loss for procedure was less than 20 mL.     Harvie Junior, M.D.     Ranae Plumber  D:  12/09/2011  T:  12/10/2011  Job:  [id number]

## 2013-06-20 ENCOUNTER — Ambulatory Visit (INDEPENDENT_AMBULATORY_CARE_PROVIDER_SITE_OTHER): Payer: BC Managed Care – PPO | Admitting: Emergency Medicine

## 2013-06-20 VITALS — BP 132/88 | HR 72 | Temp 98.3°F | Resp 18 | Ht 72.0 in | Wt 273.4 lb

## 2013-06-20 DIAGNOSIS — L0591 Pilonidal cyst without abscess: Secondary | ICD-10-CM

## 2013-06-20 DIAGNOSIS — F411 Generalized anxiety disorder: Secondary | ICD-10-CM

## 2013-06-20 MED ORDER — SULFAMETHOXAZOLE-TMP DS 800-160 MG PO TABS: 1.0000 | ORAL_TABLET | Freq: Two times a day (BID) | ORAL | Status: DC

## 2013-06-20 MED ORDER — LORAZEPAM 1 MG PO TABS: 1.0000 mg | ORAL_TABLET | Freq: Three times a day (TID) | ORAL | Status: AC | PRN

## 2013-06-20 NOTE — Patient Instructions (Signed)

## 2013-06-20 NOTE — Progress Notes (Signed)
[facility] [address], [place] [id number] [phone]  Date:  06/20/2013   Name:  Zachary CargoJohn Mclamb Jr.   DOB:  Apr 10, 1970   MRN:  [id number]  PCP:  No primary provider on file.    Chief Complaint: Possible opening incision   History of Present Illness:  Zachary CargoJohn Lewis Jr. is a 43 y.o. very pleasant male patient who presents with the following:  History of pilonidal cyst excision and a re-do.  Now concerned that he has a recurrence. Has some tenderness.  No erythema or drainage.  No fever or chills.  No history of trauma or injury.  Says he has a lot of stress in his job as he has his own job.  Has difficulty sleeping.  No improvement with over the counter medications or other home remedies. Denies other complaint or health concern today.   There are no active problems to display for this patient.   Past Medical History  Diagnosis Date  . DJD (degenerative joint disease)   . No pertinent past medical history     Past Surgical History  Procedure Laterality Date  . Arthroscopic repair acl  2007    right  . Knee arthroscopy w/ acl reconstruction  2010    left  . Knee arthroscopy  2002    left  . Pilonidal cyst excision      as child  . Knee arthroscopy  2004    right    History  Substance Use Topics  . Smoking status: Former Smoker    Quit date: 12/06/2006  . Smokeless tobacco: Not on file  . Alcohol Use: Yes    History reviewed. No pertinent family history.  Allergies  Allergen Reactions  . Augmentin [Amoxicillin-Pot Clavulanate] Hives  . Penicillins Hives    Medication list has been reviewed and updated.  No current outpatient prescriptions on file prior to visit.   No current facility-administered medications on file prior to visit.    Review of Systems:  As per HPI, otherwise negative.    Physical Examination: Filed Vitals:   06/20/13 2001  BP: 132/88  Pulse: 72  Temp: 98.3 F (36.8 C)  Resp: 18   Filed Vitals:   06/20/13 2001  Height: 6' (1.829 m)  Weight: 273 lb 6.4 oz (124.013 kg)   Body mass index is 37.07 kg/(m^2). Ideal Body Weight: Weight in (lb) to have BMI = 25: 183.9   GEN: WDWN, NAD, Non-toxic, Alert & Oriented x 3 HEENT: Atraumatic, Normocephalic.  Ears and Nose: No external deformity. EXTR: No clubbing/cyanosis/edema NEURO: Normal gait.  PSYCH: Normally interactive. Conversant. Not depressed or anxious appearing.  Calm demeanor.  Buttocks:  Erythema in intergluteal cleft.  No tenderness, mass or drainage.  Assessment and Plan: Pilonidal cyst history Septra Anxiety Ativan  Signed,  Phillips OdorJeffery Anderson, MD

## 2019-01-01 ENCOUNTER — Other Ambulatory Visit: Payer: Self-pay

## 2019-01-01 DIAGNOSIS — Z20822 Contact with and (suspected) exposure to covid-19: Secondary | ICD-10-CM

## 2019-01-04 LAB — NOVEL CORONAVIRUS, NAA: SARS-CoV-2, NAA: NOT DETECTED

## 2020-01-09 ENCOUNTER — Other Ambulatory Visit: Payer: Self-pay | Admitting: Orthopedic Surgery

## 2020-01-09 DIAGNOSIS — M25562 Pain in left knee: Secondary | ICD-10-CM

## 2020-01-30 ENCOUNTER — Other Ambulatory Visit: Payer: Self-pay

## 2020-01-30 ENCOUNTER — Ambulatory Visit
Admission: RE | Admit: 2020-01-30 | Discharge: 2020-01-30 | Disposition: A | Discharge status: Home / Self Care | Payer: BC Managed Care – PPO | Source: Ambulatory Visit | Attending: Orthopedic Surgery | Admitting: Orthopedic Surgery

## 2020-01-30 DIAGNOSIS — M25562 Pain in left knee: Secondary | ICD-10-CM

## 2021-07-06 DIAGNOSIS — H1045 Other chronic allergic conjunctivitis: Secondary | ICD-10-CM | POA: Diagnosis not present

## 2021-07-06 DIAGNOSIS — J3089 Other allergic rhinitis: Secondary | ICD-10-CM | POA: Diagnosis not present

## 2021-07-06 DIAGNOSIS — J3081 Allergic rhinitis due to animal (cat) (dog) hair and dander: Secondary | ICD-10-CM | POA: Diagnosis not present

## 2021-07-06 DIAGNOSIS — J301 Allergic rhinitis due to pollen: Secondary | ICD-10-CM | POA: Diagnosis not present

## 2021-07-29 DIAGNOSIS — J3081 Allergic rhinitis due to animal (cat) (dog) hair and dander: Secondary | ICD-10-CM | POA: Diagnosis not present

## 2021-07-29 DIAGNOSIS — J3089 Other allergic rhinitis: Secondary | ICD-10-CM | POA: Diagnosis not present

## 2021-07-29 DIAGNOSIS — J301 Allergic rhinitis due to pollen: Secondary | ICD-10-CM | POA: Diagnosis not present

## 2021-08-11 DIAGNOSIS — J3081 Allergic rhinitis due to animal (cat) (dog) hair and dander: Secondary | ICD-10-CM | POA: Diagnosis not present

## 2021-08-11 DIAGNOSIS — J301 Allergic rhinitis due to pollen: Secondary | ICD-10-CM | POA: Diagnosis not present

## 2021-08-11 DIAGNOSIS — J3089 Other allergic rhinitis: Secondary | ICD-10-CM | POA: Diagnosis not present

## 2021-08-25 DIAGNOSIS — J3081 Allergic rhinitis due to animal (cat) (dog) hair and dander: Secondary | ICD-10-CM | POA: Diagnosis not present

## 2021-08-25 DIAGNOSIS — J301 Allergic rhinitis due to pollen: Secondary | ICD-10-CM | POA: Diagnosis not present

## 2021-08-25 DIAGNOSIS — J3089 Other allergic rhinitis: Secondary | ICD-10-CM | POA: Diagnosis not present

## 2021-09-01 DIAGNOSIS — J3089 Other allergic rhinitis: Secondary | ICD-10-CM | POA: Diagnosis not present

## 2021-09-01 DIAGNOSIS — J3081 Allergic rhinitis due to animal (cat) (dog) hair and dander: Secondary | ICD-10-CM | POA: Diagnosis not present

## 2021-09-01 DIAGNOSIS — J301 Allergic rhinitis due to pollen: Secondary | ICD-10-CM | POA: Diagnosis not present

## 2021-09-10 IMAGING — MR MR KNEE*L* W/O CM
4 of 7 series · 23 of 40 positions shown · non-contrast
Comparison: None.

CLINICAL DATA: Left knee pain. History of ACL tear. Surgery 10
years ago.

EXAM:
MRI OF THE LEFT KNEE WITHOUT CONTRAST
TECHNIQUE: Multiplanar, multisequence MR imaging of the knee was performed. No
intravenous contrast was administered.

[Series 4: T2 fat-sat · coronal · 4.0mm · 0.62mm/px · 6 of 29 slices shown (1 of 2)]
[im 1/29]
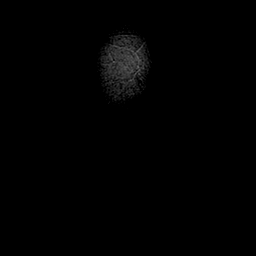
[im 6/29]
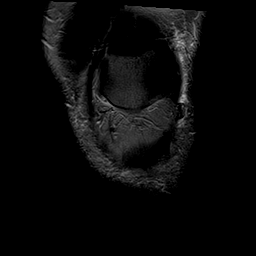
[im 12/29]
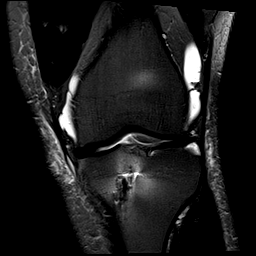
[im 17/29]
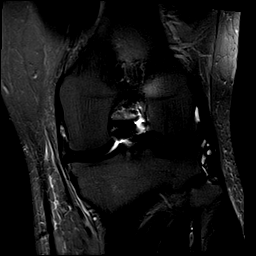
[im 23/29]
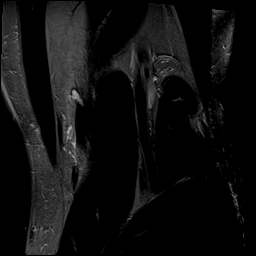
[im 29/29]
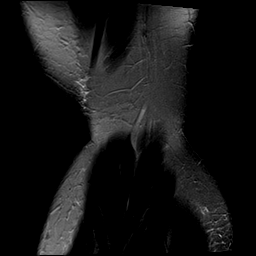

[Series 5: T1 · coronal · 4.0mm · 0.31mm/px · 5 of 29 slices shown]
[im 1/29]
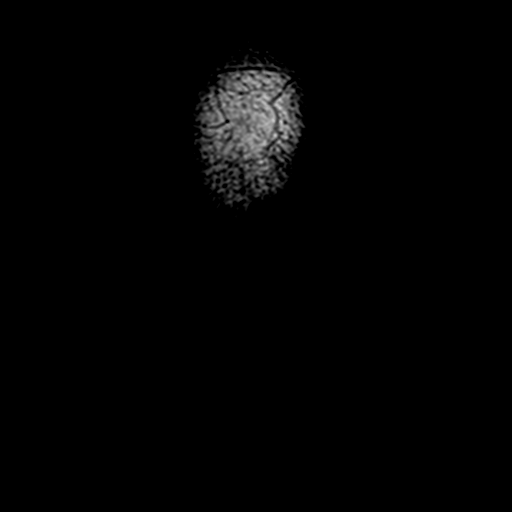
[im 6/29]
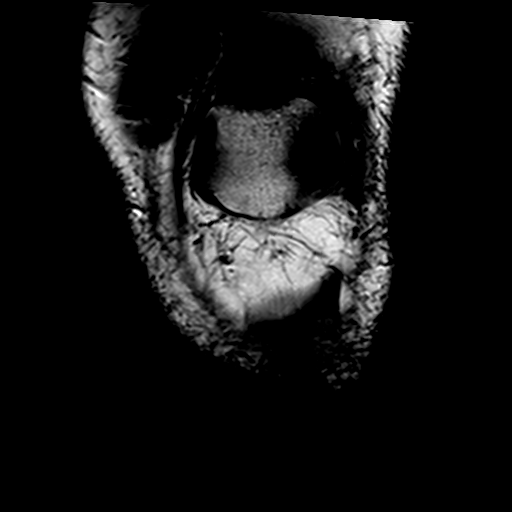
[im 12/29]
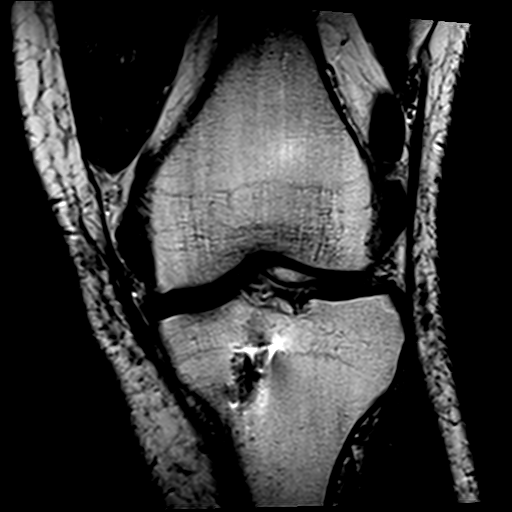
[im 17/29]
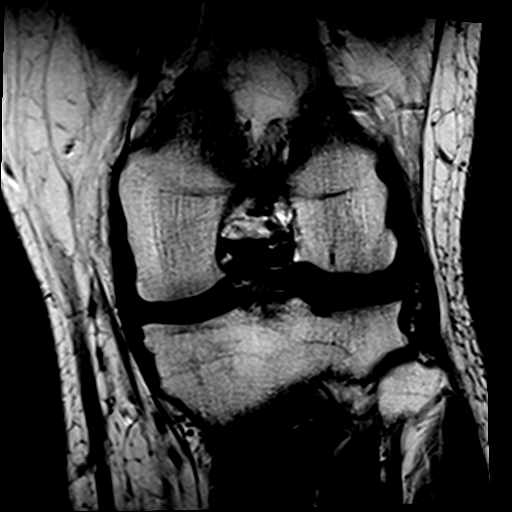
[im 29/29]
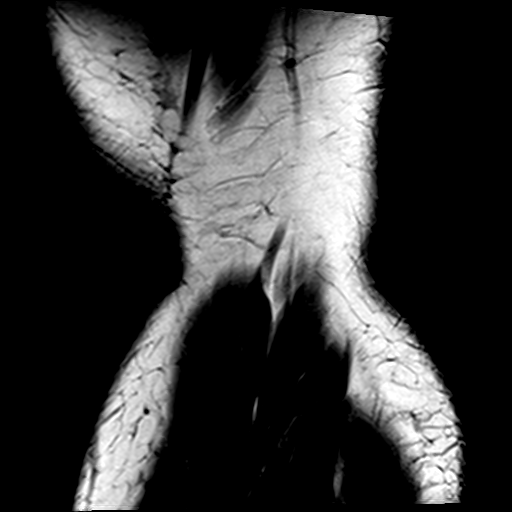

[Series 7: PD fat-sat · sagittal · 3.0mm · 0.31mm/px · 6 of 30 slices shown]
[im 1/30]
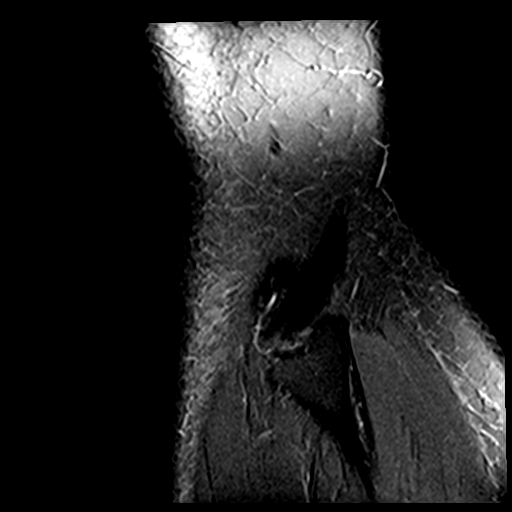
[im 6/30]
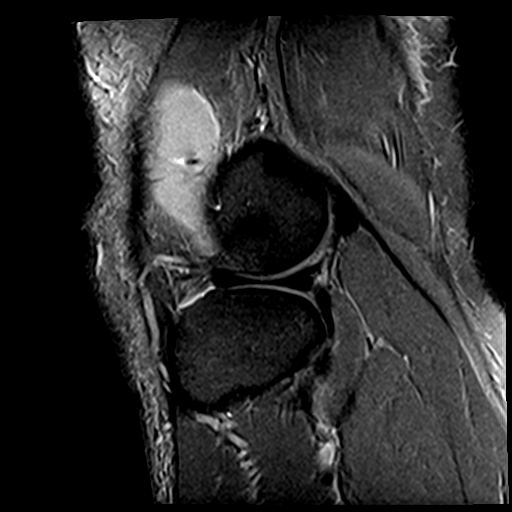
[im 12/30]
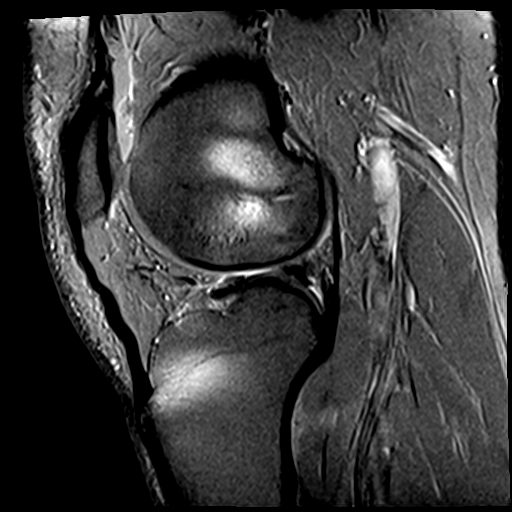
[im 18/30]
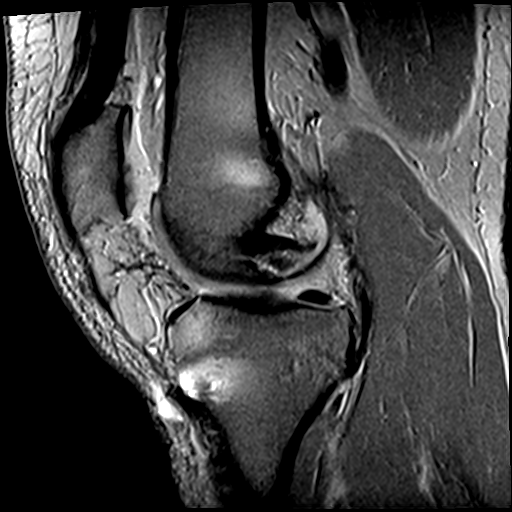
[im 24/30]
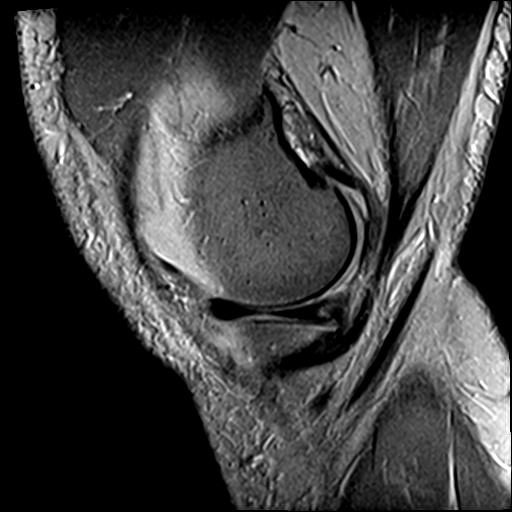
[im 30/30]
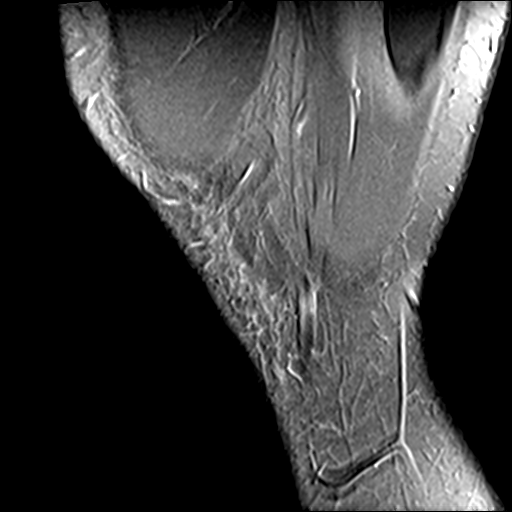

[Series 8: T2 fat-sat · sagittal · 3.0mm · 0.31mm/px · 6 of 30 slices shown (2 of 2)]
[im 1/30]
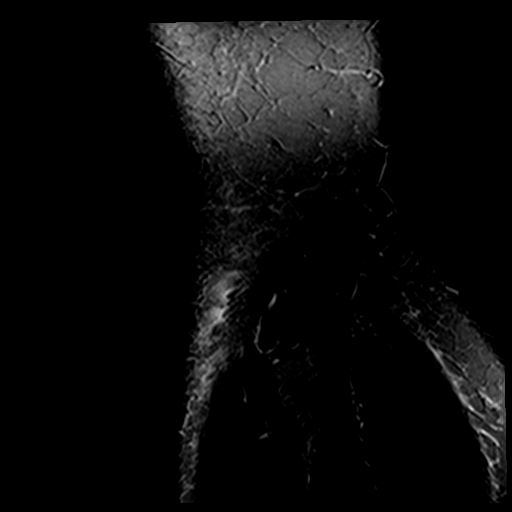
[im 6/30]
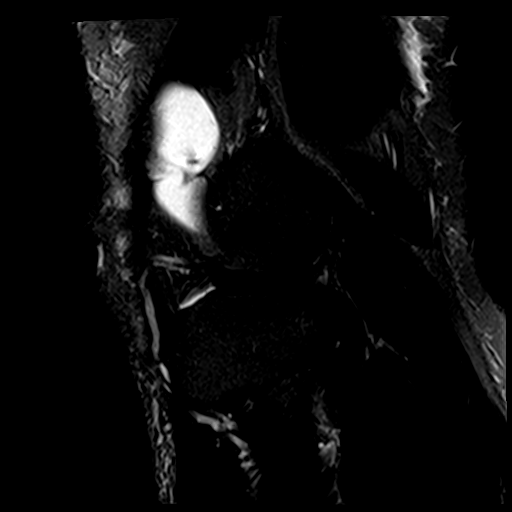
[im 12/30]
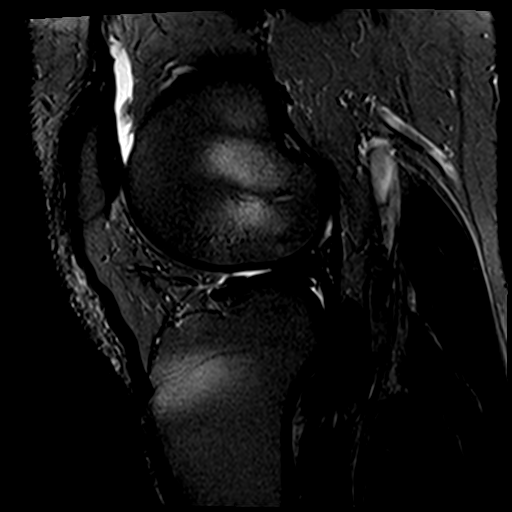
[im 18/30]
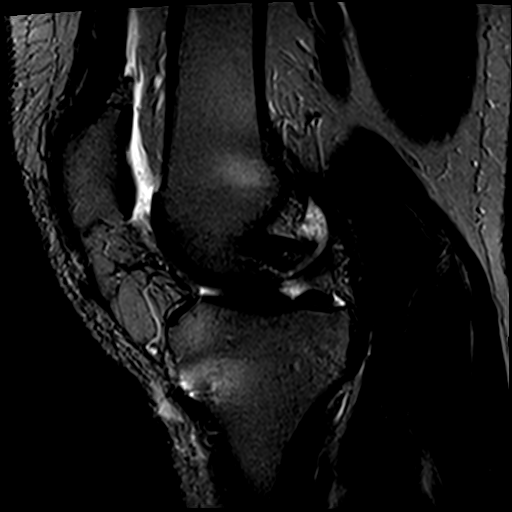
[im 24/30]
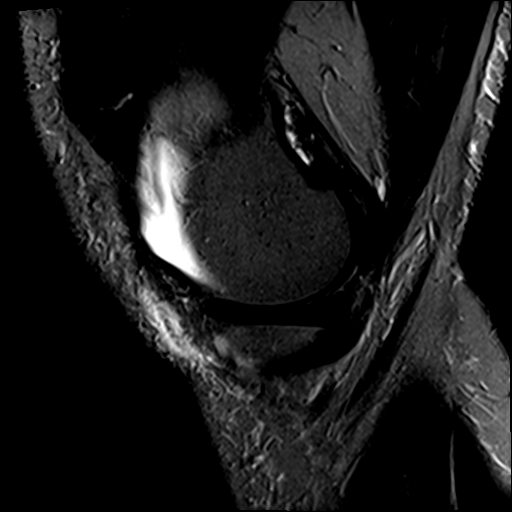
[im 30/30]
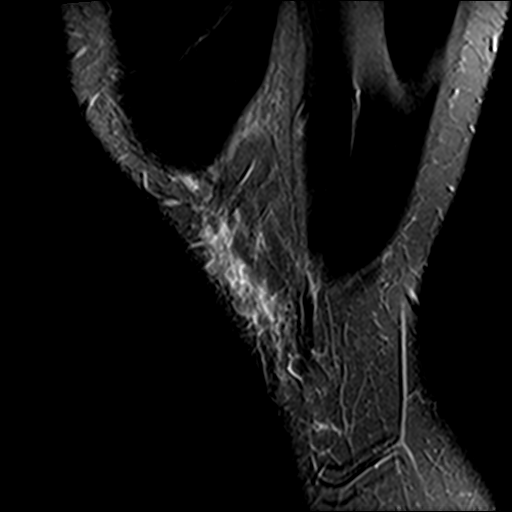

[23 of 40 positions shown; findings below may reference images not displayed]

FINDINGS: MENISCI

Medial: Irregularity along the inferior aspect of posterior
horn-body junction of the medial meniscus likely reflecting prior
debridement.

Lateral: Intact.

LIGAMENTS

Cruciates: Prior ACL repair. Complete chronic tear of the ACL graft.
Intact PCL.

Collaterals: Medial collateral ligament is intact. Lateral
collateral ligament complex is intact.

CARTILAGE

Patellofemoral: Partial-thickness cartilage loss of the medial
patellar facet.

Medial: Mild partial-thickness cartilage loss of the posterior
weight-bearing surface of the medial femoral condyle.

Lateral:  No chondral defect.

JOINT: Moderate joint effusion. Normal Kussy Gaitan. No plical
thickening.

POPLITEAL FOSSA: Popliteus tendon is intact. No Baker's cyst.

EXTENSOR MECHANISM: Intact quadriceps tendon. Intact patellar
tendon. Intact lateral patellar retinaculum. Intact medial patellar
retinaculum. Intact MPFL.

BONES: No aggressive osseous lesion. No fracture or dislocation.

Other: No fluid collection or hematoma. Muscles are normal.
IMPRESSION: 1. Prior ACL repair. Complete chronic tear of the ACL graft.
2. Irregularity along the inferior aspect of posterior horn-body
junction of the medial meniscus likely reflecting prior debridement.
3. Partial-thickness cartilage loss of the medial patellar facet.
4. Mild partial-thickness cartilage loss of the posterior
weight-bearing surface of the medial femoral condyle.

## 2021-10-05 DIAGNOSIS — J301 Allergic rhinitis due to pollen: Secondary | ICD-10-CM | POA: Diagnosis not present

## 2021-10-05 DIAGNOSIS — J3089 Other allergic rhinitis: Secondary | ICD-10-CM | POA: Diagnosis not present

## 2021-10-05 DIAGNOSIS — J3081 Allergic rhinitis due to animal (cat) (dog) hair and dander: Secondary | ICD-10-CM | POA: Diagnosis not present

## 2021-10-14 DIAGNOSIS — J3089 Other allergic rhinitis: Secondary | ICD-10-CM | POA: Diagnosis not present

## 2021-10-14 DIAGNOSIS — J301 Allergic rhinitis due to pollen: Secondary | ICD-10-CM | POA: Diagnosis not present

## 2021-10-14 DIAGNOSIS — J3081 Allergic rhinitis due to animal (cat) (dog) hair and dander: Secondary | ICD-10-CM | POA: Diagnosis not present

## 2021-10-27 DIAGNOSIS — J301 Allergic rhinitis due to pollen: Secondary | ICD-10-CM | POA: Diagnosis not present

## 2021-10-27 DIAGNOSIS — J3089 Other allergic rhinitis: Secondary | ICD-10-CM | POA: Diagnosis not present

## 2021-10-27 DIAGNOSIS — J3081 Allergic rhinitis due to animal (cat) (dog) hair and dander: Secondary | ICD-10-CM | POA: Diagnosis not present

## 2021-11-04 DIAGNOSIS — J301 Allergic rhinitis due to pollen: Secondary | ICD-10-CM | POA: Diagnosis not present

## 2021-11-04 DIAGNOSIS — J3081 Allergic rhinitis due to animal (cat) (dog) hair and dander: Secondary | ICD-10-CM | POA: Diagnosis not present

## 2021-11-04 DIAGNOSIS — J3089 Other allergic rhinitis: Secondary | ICD-10-CM | POA: Diagnosis not present

## 2021-11-10 DIAGNOSIS — J301 Allergic rhinitis due to pollen: Secondary | ICD-10-CM | POA: Diagnosis not present

## 2021-11-10 DIAGNOSIS — J3081 Allergic rhinitis due to animal (cat) (dog) hair and dander: Secondary | ICD-10-CM | POA: Diagnosis not present

## 2021-11-10 DIAGNOSIS — J3089 Other allergic rhinitis: Secondary | ICD-10-CM | POA: Diagnosis not present

## 2021-11-23 DIAGNOSIS — J3081 Allergic rhinitis due to animal (cat) (dog) hair and dander: Secondary | ICD-10-CM | POA: Diagnosis not present

## 2021-11-23 DIAGNOSIS — J301 Allergic rhinitis due to pollen: Secondary | ICD-10-CM | POA: Diagnosis not present

## 2021-11-23 DIAGNOSIS — J3089 Other allergic rhinitis: Secondary | ICD-10-CM | POA: Diagnosis not present

## 2021-11-30 DIAGNOSIS — J3081 Allergic rhinitis due to animal (cat) (dog) hair and dander: Secondary | ICD-10-CM | POA: Diagnosis not present

## 2021-11-30 DIAGNOSIS — J301 Allergic rhinitis due to pollen: Secondary | ICD-10-CM | POA: Diagnosis not present

## 2021-11-30 DIAGNOSIS — J3089 Other allergic rhinitis: Secondary | ICD-10-CM | POA: Diagnosis not present

## 2021-12-08 DIAGNOSIS — J3089 Other allergic rhinitis: Secondary | ICD-10-CM | POA: Diagnosis not present

## 2021-12-08 DIAGNOSIS — J301 Allergic rhinitis due to pollen: Secondary | ICD-10-CM | POA: Diagnosis not present

## 2021-12-08 DIAGNOSIS — J3081 Allergic rhinitis due to animal (cat) (dog) hair and dander: Secondary | ICD-10-CM | POA: Diagnosis not present

## 2022-01-03 DIAGNOSIS — J3081 Allergic rhinitis due to animal (cat) (dog) hair and dander: Secondary | ICD-10-CM | POA: Diagnosis not present

## 2022-01-03 DIAGNOSIS — J301 Allergic rhinitis due to pollen: Secondary | ICD-10-CM | POA: Diagnosis not present

## 2022-01-03 DIAGNOSIS — J3089 Other allergic rhinitis: Secondary | ICD-10-CM | POA: Diagnosis not present

## 2022-01-10 DIAGNOSIS — J3089 Other allergic rhinitis: Secondary | ICD-10-CM | POA: Diagnosis not present

## 2022-01-10 DIAGNOSIS — J301 Allergic rhinitis due to pollen: Secondary | ICD-10-CM | POA: Diagnosis not present

## 2022-01-10 DIAGNOSIS — J3081 Allergic rhinitis due to animal (cat) (dog) hair and dander: Secondary | ICD-10-CM | POA: Diagnosis not present

## 2022-01-26 DIAGNOSIS — G4733 Obstructive sleep apnea (adult) (pediatric): Secondary | ICD-10-CM | POA: Diagnosis not present

## 2022-02-14 DIAGNOSIS — J3081 Allergic rhinitis due to animal (cat) (dog) hair and dander: Secondary | ICD-10-CM | POA: Diagnosis not present

## 2022-02-14 DIAGNOSIS — J301 Allergic rhinitis due to pollen: Secondary | ICD-10-CM | POA: Diagnosis not present

## 2022-02-14 DIAGNOSIS — J3089 Other allergic rhinitis: Secondary | ICD-10-CM | POA: Diagnosis not present

## 2022-02-21 DIAGNOSIS — L814 Other melanin hyperpigmentation: Secondary | ICD-10-CM | POA: Diagnosis not present

## 2022-02-21 DIAGNOSIS — D225 Melanocytic nevi of trunk: Secondary | ICD-10-CM | POA: Diagnosis not present

## 2022-02-21 DIAGNOSIS — L821 Other seborrheic keratosis: Secondary | ICD-10-CM | POA: Diagnosis not present

## 2022-02-21 DIAGNOSIS — J3089 Other allergic rhinitis: Secondary | ICD-10-CM | POA: Diagnosis not present

## 2022-02-21 DIAGNOSIS — L570 Actinic keratosis: Secondary | ICD-10-CM | POA: Diagnosis not present

## 2022-02-21 DIAGNOSIS — J301 Allergic rhinitis due to pollen: Secondary | ICD-10-CM | POA: Diagnosis not present

## 2022-02-21 DIAGNOSIS — J3081 Allergic rhinitis due to animal (cat) (dog) hair and dander: Secondary | ICD-10-CM | POA: Diagnosis not present

## 2022-07-03 ENCOUNTER — Emergency Department (HOSPITAL_BASED_OUTPATIENT_CLINIC_OR_DEPARTMENT_OTHER): Payer: No Typology Code available for payment source

## 2022-07-03 ENCOUNTER — Emergency Department (HOSPITAL_BASED_OUTPATIENT_CLINIC_OR_DEPARTMENT_OTHER)
Admission: EM | Admit: 2022-07-03 | Discharge: 2022-07-03 | Disposition: A | Discharge status: Home / Self Care | Payer: No Typology Code available for payment source | Attending: Emergency Medicine | Admitting: Emergency Medicine

## 2022-07-03 DIAGNOSIS — R0789 Other chest pain: Secondary | ICD-10-CM | POA: Insufficient documentation

## 2022-07-03 DIAGNOSIS — R079 Chest pain, unspecified: Secondary | ICD-10-CM | POA: Diagnosis present

## 2022-07-03 LAB — BASIC METABOLIC PANEL
Anion gap: 10 (ref 5–15)
BUN: 22 mg/dL — ABNORMAL HIGH (ref 6–20)
CO2: 26 mmol/L (ref 22–32)
Calcium: 9.7 mg/dL (ref 8.9–10.3)
Chloride: 103 mmol/L (ref 98–111)
Creatinine, Ser: 0.91 mg/dL (ref 0.61–1.24)
GFR, Estimated: >60 mL/min (ref >60)
Glucose, Bld: 114 mg/dL — ABNORMAL HIGH (ref 70–99)
Potassium: 3.9 mmol/L (ref 3.5–5.1)
Sodium: 139 mmol/L (ref 135–145)

## 2022-07-03 LAB — CBC
HCT: 44.2 % (ref 39.0–52.0)
Hemoglobin: 15.2 g/dL (ref 13.0–17.0)
MCH: 30.5 pg (ref 26.0–34.0)
MCHC: 34.4 g/dL (ref 30.0–36.0)
MCV: 88.6 fL (ref 80.0–100.0)
Platelets: 204 10*3/uL (ref 150–400)
RBC: 4.99 MIL/uL (ref 4.22–5.81)
RDW: 13.1 % (ref 11.5–15.5)
WBC: 8.2 10*3/uL (ref 4.0–10.5)
nRBC: 0 % (ref 0.0–0.2)

## 2022-07-03 LAB — TROPONIN I (HIGH SENSITIVITY)
Troponin I (High Sensitivity): 8 ng/L (ref <18)
Troponin I (High Sensitivity): 6 ng/L (ref <18)

## 2022-07-03 NOTE — ED Notes (Signed)
Pt also reports left arm "numb spots" that started today.

## 2022-07-03 NOTE — ED Provider Notes (Signed)
EMERGENCY DEPARTMENT AT [facility] Provider Note   CSN: [id number] Arrival date & time: 07/03/22  1047     History  Chief Complaint  Patient presents with   Chest Pain    Zachary Finley. is a 52 y.o. male.  52 year old male with no past medical history presents to the ED with a chief complaint of left shoulder pain that is been ongoing for approximately 11 days.  Reports that he went to his primary care physician originally, had blood work, an EKG which all looked unremarkable.  He reports the pain dissipated then.  When he was vacationing at the beach approximately 2 weeks ago, he began to feel that cramping, stabbing pain on the posterior aspect of his left shoulder radiating to his chest.  Reports the pain now waxes and wanes from the front to the back.  He also saw a chiropractor, had an adjustment, reports this improved the pain for approximately 50 minutes, he also purchased the TENS units that were used for this.  States that this did not improve his symptoms at all.  Today he is concerned as he friend who told him the pain in the left chest could have been a valve malfunction of the posterior aspect of his heart.  He was outside with his daughter yesterday was doing some exercise, which actually improved the pain.  He has tried some Aleve, also was taking medication for reflux without any improvement in his symptoms.  No prior hx of CAD, no FHX of CAD, no tobacco use, no prior hx of blood clots.   The history is provided by the patient.  Chest Pain Associated symptoms: no abdominal pain, no fever, no nausea, no shortness of breath and no vomiting        Home Medications Prior to Admission medications   Medication Sig Start Date End Date Taking? Authorizing Provider  LORazepam (ATIVAN) 1 MG tablet Take 1 tablet (1 mg total) by mouth every 8 (eight) hours as needed for anxiety. 06/20/13   Carmelina Dane, MD  sulfamethoxazole-trimethoprim (BACTRIM DS)  800-160 MG per tablet Take 1 tablet by mouth 2 (two) times daily. 06/20/13   Carmelina Dane, MD      Allergies    Augmentin [amoxicillin-pot clavulanate] and Penicillins    Review of Systems   Review of Systems  Constitutional:  Negative for chills and fever.  Respiratory:  Negative for shortness of breath.   Cardiovascular:  Positive for chest pain.  Gastrointestinal:  Negative for abdominal pain, nausea and vomiting.  Genitourinary:  Negative for flank pain.  All other systems reviewed and are negative.   Physical Exam Updated Vital Signs BP (!) 145/89   Pulse 62   Temp 98.1 F (36.7 C) (Oral)   Resp 12   SpO2 100%  Physical Exam Vitals and nursing note reviewed.  Constitutional:      Appearance: He is well-developed.  HENT:     Head: Normocephalic and atraumatic.  Eyes:     General: No scleral icterus.    Pupils: Pupils are equal, round, and reactive to light.  Cardiovascular:     Heart sounds: Normal heart sounds.  Pulmonary:     Effort: Pulmonary effort is normal.     Breath sounds: Normal breath sounds. No wheezing.  Chest:     Chest wall: No tenderness.  Abdominal:     General: Bowel sounds are normal. There is no distension.     Palpations: Abdomen is soft.  Tenderness: There is no abdominal tenderness.  Musculoskeletal:        General: No tenderness or deformity.     Cervical back: Normal range of motion.  Skin:    General: Skin is warm and dry.  Neurological:     Mental Status: He is alert and oriented to person, place, and time.     ED Results / Procedures / Treatments   Labs (all labs ordered are listed, but only abnormal results are displayed) Labs Reviewed  BASIC METABOLIC PANEL - Abnormal; Notable for the following components:      Result Value   Glucose, Bld 114 (*)    BUN 22 (*)    All other components within normal limits  CBC  TROPONIN I (HIGH SENSITIVITY)  TROPONIN I (HIGH SENSITIVITY)    EKG EKG  Interpretation  Date/Time:  Sunday July 03 2022 10:56:28 EDT Ventricular Rate:  72 PR Interval:  124 QRS Duration: 94 QT Interval:  388 QTC Calculation: 424 R Axis:   35 Text Interpretation: Normal sinus rhythm with sinus arrhythmia Anterior infarct , age undetermined Abnormal ECG No old tracing to compare Confirmed by Melene PlanFloyd, Dan [phone]([id number]) on 07/03/2022 11:00:42 AM  Radiology DG Chest Port 1 View  Result Date: 07/03/2022 CLINICAL DATA:  Chest pain EXAM: PORTABLE CHEST 1 VIEW COMPARISON:  None Available. FINDINGS: The heart size and mediastinal contours are within normal limits. Both lungs are clear. The visualized skeletal structures are unremarkable. IMPRESSION: No active disease. Electronically Signed   By: Duanne GuessNicholas  Plundo D.O.   On: 07/03/2022 12:06    Procedures Procedures    Medications Ordered in ED Medications - No data to display  ED Course/ Medical Decision Making/ A&P                             Medical Decision Making Amount and/or Complexity of Data Reviewed Labs: ordered. Radiology: ordered.  This patient presents to the ED for concern of chest pain, this involves a number of treatment options, and is a complaint that carries with it a high risk of complications and morbidity.  The differential diagnosis includes ACS, PE, dissection versus MSK.    Co morbidities: Discussed in HPI   Brief History:  See HPI.   EMR reviewed including pt PMHx, past surgical history and past visits to ER.   See HPI for more details   Lab Tests:  I ordered and independently interpreted labs.  The pertinent results include:    I personally reviewed all laboratory work and imaging. Metabolic panel without any acute abnormality specifically kidney function within normal limits and no significant electrolyte abnormalities. CBC without leukocytosis or significant anemia. Troponin x 2 remain flat.    Imaging Studies:  NAD. I personally reviewed all imaging studies and no acute  abnormality found. I agree with radiology interpretation.    Cardiac Monitoring:  The patient was maintained on a cardiac monitor.  I personally viewed and interpreted the cardiac monitored which showed an underlying rhythm of: NSR 72 EKG non-ischemic   Medicines ordered:  N/A   Reevaluation:  After the interventions noted above I re-evaluated patient and found that they have :stayed the same   Social Determinants of Health:  The patient's social determinants of health were a factor in the care of this patient  Problem List / ED Course:  Patient here with left older pain that is been ongoing for the past 11 days he reports symptoms  first began when he was on vacation at the beach, reports he was not under any distress when this was occurring.  Not have any prior cardiac history, has taken some over-the-counter medication for pain control.  Feels that the pain radiates into his left side of his chest.  He does not have any history of CAD, no family history of CAD, no history of tobacco use.  Arrived to the ED in stable condition with stable vital signs, under no distress.  Here on evaluation there is pain with palpation along the posterior aspect of his scapula.  No palpable pain along his chest.  Feels that this is likely intermittent.  His labs were evaluated today.  CBC unremarkable.  BMP with no electrolyte derangement, creatinine levels unremarkable.  First troponin is negative, delta was obtained which is also negative.  His EKG is normal sinus rhythm.  Chest x-ray without any signs of pleural effusion, no signs of infection or pneumonia. HEART SCORE 1 He is not tachycardic, no hypoxia, no prior hx of blood clots. PERC negative.  I discussed this results with patient, he does report his pain sometimes intensifies when he was yawning in the room, he is concerned that this is likely MSK component.  We discussed outpatient follow-up with cardiology feels that his symptoms are not  improving.  He does have primary care and could also see them as well.  He is hemodynamically stable for discharge.  Return precautions discussed at length.  Dispostion:  After consideration of the diagnostic results and the patients response to treatment, I feel that the patent would benefit from appropriate follow up with PCP.    Portions of this note were generated with Scientist, clinical (histocompatibility and immunogenetics). Dictation errors may occur despite best attempts at proofreading.   Final Clinical Impression(s) / ED Diagnoses Final diagnoses:  Atypical chest pain    Rx / DC Orders ED Discharge Orders     None         Claude Manges, PA-C 07/03/22 1446    Melene Plan, DO 07/03/22 1456

## 2022-07-03 NOTE — ED Triage Notes (Signed)
Pt reports left shoulder blade pain 1 week ago, worsening over time, relieved at times with positioning, at times radiating to right upper chest.  Pt has tried OTC meds with no relief.

## 2022-07-03 NOTE — Discharge Instructions (Addendum)
Your laboratory results were within normal limits today.  Your chest xray did not show any acute findings.  Please follow up with your primary care physician as needed.

## 2022-07-03 NOTE — ED Notes (Signed)
Discharge paperwork given and verbally understood. 

## 2022-07-18 ENCOUNTER — Ambulatory Visit: Payer: No Typology Code available for payment source | Attending: Cardiology | Admitting: Cardiology

## 2023-02-13 DIAGNOSIS — J3089 Other allergic rhinitis: Secondary | ICD-10-CM | POA: Diagnosis not present

## 2023-02-13 DIAGNOSIS — J301 Allergic rhinitis due to pollen: Secondary | ICD-10-CM | POA: Diagnosis not present

## 2023-02-13 DIAGNOSIS — J3081 Allergic rhinitis due to animal (cat) (dog) hair and dander: Secondary | ICD-10-CM | POA: Diagnosis not present

## 2023-02-15 DIAGNOSIS — J3089 Other allergic rhinitis: Secondary | ICD-10-CM | POA: Diagnosis not present

## 2023-02-15 DIAGNOSIS — J301 Allergic rhinitis due to pollen: Secondary | ICD-10-CM | POA: Diagnosis not present

## 2023-02-15 DIAGNOSIS — J3081 Allergic rhinitis due to animal (cat) (dog) hair and dander: Secondary | ICD-10-CM | POA: Diagnosis not present

## 2023-02-20 DIAGNOSIS — J301 Allergic rhinitis due to pollen: Secondary | ICD-10-CM | POA: Diagnosis not present

## 2023-02-20 DIAGNOSIS — J3081 Allergic rhinitis due to animal (cat) (dog) hair and dander: Secondary | ICD-10-CM | POA: Diagnosis not present

## 2023-02-20 DIAGNOSIS — J3089 Other allergic rhinitis: Secondary | ICD-10-CM | POA: Diagnosis not present

## 2023-03-01 DIAGNOSIS — J3089 Other allergic rhinitis: Secondary | ICD-10-CM | POA: Diagnosis not present

## 2023-03-01 DIAGNOSIS — J301 Allergic rhinitis due to pollen: Secondary | ICD-10-CM | POA: Diagnosis not present

## 2023-03-01 DIAGNOSIS — J3081 Allergic rhinitis due to animal (cat) (dog) hair and dander: Secondary | ICD-10-CM | POA: Diagnosis not present

## 2023-03-02 DIAGNOSIS — D225 Melanocytic nevi of trunk: Secondary | ICD-10-CM | POA: Diagnosis not present

## 2023-03-02 DIAGNOSIS — L821 Other seborrheic keratosis: Secondary | ICD-10-CM | POA: Diagnosis not present

## 2023-03-02 DIAGNOSIS — L814 Other melanin hyperpigmentation: Secondary | ICD-10-CM | POA: Diagnosis not present

## 2023-03-02 DIAGNOSIS — C44622 Squamous cell carcinoma of skin of right upper limb, including shoulder: Secondary | ICD-10-CM | POA: Diagnosis not present

## 2023-03-02 DIAGNOSIS — D492 Neoplasm of unspecified behavior of bone, soft tissue, and skin: Secondary | ICD-10-CM | POA: Diagnosis not present

## 2023-03-03 DIAGNOSIS — J3089 Other allergic rhinitis: Secondary | ICD-10-CM | POA: Diagnosis not present

## 2023-03-03 DIAGNOSIS — J3081 Allergic rhinitis due to animal (cat) (dog) hair and dander: Secondary | ICD-10-CM | POA: Diagnosis not present

## 2023-03-03 DIAGNOSIS — J301 Allergic rhinitis due to pollen: Secondary | ICD-10-CM | POA: Diagnosis not present

## 2023-03-06 DIAGNOSIS — J301 Allergic rhinitis due to pollen: Secondary | ICD-10-CM | POA: Diagnosis not present

## 2023-03-06 DIAGNOSIS — J3081 Allergic rhinitis due to animal (cat) (dog) hair and dander: Secondary | ICD-10-CM | POA: Diagnosis not present

## 2023-03-06 DIAGNOSIS — J3089 Other allergic rhinitis: Secondary | ICD-10-CM | POA: Diagnosis not present

## 2023-03-17 DIAGNOSIS — J301 Allergic rhinitis due to pollen: Secondary | ICD-10-CM | POA: Diagnosis not present

## 2023-03-17 DIAGNOSIS — J3081 Allergic rhinitis due to animal (cat) (dog) hair and dander: Secondary | ICD-10-CM | POA: Diagnosis not present

## 2023-03-17 DIAGNOSIS — J3089 Other allergic rhinitis: Secondary | ICD-10-CM | POA: Diagnosis not present

## 2023-03-20 DIAGNOSIS — J3081 Allergic rhinitis due to animal (cat) (dog) hair and dander: Secondary | ICD-10-CM | POA: Diagnosis not present

## 2023-03-20 DIAGNOSIS — J3089 Other allergic rhinitis: Secondary | ICD-10-CM | POA: Diagnosis not present

## 2023-03-20 DIAGNOSIS — J301 Allergic rhinitis due to pollen: Secondary | ICD-10-CM | POA: Diagnosis not present

## 2023-03-30 DIAGNOSIS — H6991 Unspecified Eustachian tube disorder, right ear: Secondary | ICD-10-CM | POA: Diagnosis not present

## 2023-03-30 DIAGNOSIS — H6121 Impacted cerumen, right ear: Secondary | ICD-10-CM | POA: Diagnosis not present

## 2023-04-17 DIAGNOSIS — J3081 Allergic rhinitis due to animal (cat) (dog) hair and dander: Secondary | ICD-10-CM | POA: Diagnosis not present

## 2023-04-17 DIAGNOSIS — J301 Allergic rhinitis due to pollen: Secondary | ICD-10-CM | POA: Diagnosis not present

## 2023-04-17 DIAGNOSIS — J3089 Other allergic rhinitis: Secondary | ICD-10-CM | POA: Diagnosis not present

## 2023-04-20 DIAGNOSIS — J301 Allergic rhinitis due to pollen: Secondary | ICD-10-CM | POA: Diagnosis not present

## 2023-04-20 DIAGNOSIS — Z713 Dietary counseling and surveillance: Secondary | ICD-10-CM | POA: Diagnosis not present

## 2023-04-20 DIAGNOSIS — Z6841 Body Mass Index (BMI) 40.0 and over, adult: Secondary | ICD-10-CM | POA: Diagnosis not present

## 2023-04-20 DIAGNOSIS — J3089 Other allergic rhinitis: Secondary | ICD-10-CM | POA: Diagnosis not present

## 2023-04-20 DIAGNOSIS — J3081 Allergic rhinitis due to animal (cat) (dog) hair and dander: Secondary | ICD-10-CM | POA: Diagnosis not present

## 2023-04-25 DIAGNOSIS — J3089 Other allergic rhinitis: Secondary | ICD-10-CM | POA: Diagnosis not present

## 2023-04-25 DIAGNOSIS — C44622 Squamous cell carcinoma of skin of right upper limb, including shoulder: Secondary | ICD-10-CM | POA: Diagnosis not present

## 2023-04-25 DIAGNOSIS — J301 Allergic rhinitis due to pollen: Secondary | ICD-10-CM | POA: Diagnosis not present

## 2023-04-25 DIAGNOSIS — J3081 Allergic rhinitis due to animal (cat) (dog) hair and dander: Secondary | ICD-10-CM | POA: Diagnosis not present

## 2023-05-02 DIAGNOSIS — J301 Allergic rhinitis due to pollen: Secondary | ICD-10-CM | POA: Diagnosis not present

## 2023-05-02 DIAGNOSIS — J3089 Other allergic rhinitis: Secondary | ICD-10-CM | POA: Diagnosis not present

## 2023-05-02 DIAGNOSIS — J3081 Allergic rhinitis due to animal (cat) (dog) hair and dander: Secondary | ICD-10-CM | POA: Diagnosis not present

## 2023-05-04 DIAGNOSIS — J3089 Other allergic rhinitis: Secondary | ICD-10-CM | POA: Diagnosis not present

## 2023-05-04 DIAGNOSIS — J301 Allergic rhinitis due to pollen: Secondary | ICD-10-CM | POA: Diagnosis not present

## 2023-05-04 DIAGNOSIS — J3081 Allergic rhinitis due to animal (cat) (dog) hair and dander: Secondary | ICD-10-CM | POA: Diagnosis not present

## 2023-05-09 DIAGNOSIS — J3089 Other allergic rhinitis: Secondary | ICD-10-CM | POA: Diagnosis not present

## 2023-05-09 DIAGNOSIS — J301 Allergic rhinitis due to pollen: Secondary | ICD-10-CM | POA: Diagnosis not present

## 2023-05-09 DIAGNOSIS — J3081 Allergic rhinitis due to animal (cat) (dog) hair and dander: Secondary | ICD-10-CM | POA: Diagnosis not present

## 2023-05-12 DIAGNOSIS — J3089 Other allergic rhinitis: Secondary | ICD-10-CM | POA: Diagnosis not present

## 2023-05-12 DIAGNOSIS — J3081 Allergic rhinitis due to animal (cat) (dog) hair and dander: Secondary | ICD-10-CM | POA: Diagnosis not present

## 2023-05-12 DIAGNOSIS — J301 Allergic rhinitis due to pollen: Secondary | ICD-10-CM | POA: Diagnosis not present

## 2023-05-17 DIAGNOSIS — J3081 Allergic rhinitis due to animal (cat) (dog) hair and dander: Secondary | ICD-10-CM | POA: Diagnosis not present

## 2023-05-17 DIAGNOSIS — J301 Allergic rhinitis due to pollen: Secondary | ICD-10-CM | POA: Diagnosis not present

## 2023-05-17 DIAGNOSIS — J3089 Other allergic rhinitis: Secondary | ICD-10-CM | POA: Diagnosis not present

## 2023-05-22 DIAGNOSIS — J301 Allergic rhinitis due to pollen: Secondary | ICD-10-CM | POA: Diagnosis not present

## 2023-05-22 DIAGNOSIS — Z6841 Body Mass Index (BMI) 40.0 and over, adult: Secondary | ICD-10-CM | POA: Diagnosis not present

## 2023-05-22 DIAGNOSIS — J3089 Other allergic rhinitis: Secondary | ICD-10-CM | POA: Diagnosis not present

## 2023-05-22 DIAGNOSIS — J3081 Allergic rhinitis due to animal (cat) (dog) hair and dander: Secondary | ICD-10-CM | POA: Diagnosis not present

## 2023-05-22 DIAGNOSIS — Z713 Dietary counseling and surveillance: Secondary | ICD-10-CM | POA: Diagnosis not present

## 2023-05-22 DIAGNOSIS — R030 Elevated blood-pressure reading, without diagnosis of hypertension: Secondary | ICD-10-CM | POA: Diagnosis not present

## 2023-06-05 DIAGNOSIS — J3081 Allergic rhinitis due to animal (cat) (dog) hair and dander: Secondary | ICD-10-CM | POA: Diagnosis not present

## 2023-06-05 DIAGNOSIS — J3089 Other allergic rhinitis: Secondary | ICD-10-CM | POA: Diagnosis not present

## 2023-06-05 DIAGNOSIS — J301 Allergic rhinitis due to pollen: Secondary | ICD-10-CM | POA: Diagnosis not present

## 2023-06-09 DIAGNOSIS — J3081 Allergic rhinitis due to animal (cat) (dog) hair and dander: Secondary | ICD-10-CM | POA: Diagnosis not present

## 2023-06-09 DIAGNOSIS — J3089 Other allergic rhinitis: Secondary | ICD-10-CM | POA: Diagnosis not present

## 2023-06-09 DIAGNOSIS — J301 Allergic rhinitis due to pollen: Secondary | ICD-10-CM | POA: Diagnosis not present

## 2023-06-15 DIAGNOSIS — J3089 Other allergic rhinitis: Secondary | ICD-10-CM | POA: Diagnosis not present

## 2023-06-15 DIAGNOSIS — J3081 Allergic rhinitis due to animal (cat) (dog) hair and dander: Secondary | ICD-10-CM | POA: Diagnosis not present

## 2023-06-15 DIAGNOSIS — J301 Allergic rhinitis due to pollen: Secondary | ICD-10-CM | POA: Diagnosis not present

## 2023-06-22 DIAGNOSIS — J3081 Allergic rhinitis due to animal (cat) (dog) hair and dander: Secondary | ICD-10-CM | POA: Diagnosis not present

## 2023-06-22 DIAGNOSIS — J3089 Other allergic rhinitis: Secondary | ICD-10-CM | POA: Diagnosis not present

## 2023-06-22 DIAGNOSIS — J301 Allergic rhinitis due to pollen: Secondary | ICD-10-CM | POA: Diagnosis not present

## 2023-07-04 DIAGNOSIS — J3081 Allergic rhinitis due to animal (cat) (dog) hair and dander: Secondary | ICD-10-CM | POA: Diagnosis not present

## 2023-07-04 DIAGNOSIS — J3089 Other allergic rhinitis: Secondary | ICD-10-CM | POA: Diagnosis not present

## 2023-07-04 DIAGNOSIS — J301 Allergic rhinitis due to pollen: Secondary | ICD-10-CM | POA: Diagnosis not present

## 2023-07-26 DIAGNOSIS — J3089 Other allergic rhinitis: Secondary | ICD-10-CM | POA: Diagnosis not present

## 2023-07-26 DIAGNOSIS — J301 Allergic rhinitis due to pollen: Secondary | ICD-10-CM | POA: Diagnosis not present

## 2023-07-26 DIAGNOSIS — J3081 Allergic rhinitis due to animal (cat) (dog) hair and dander: Secondary | ICD-10-CM | POA: Diagnosis not present

## 2023-08-01 DIAGNOSIS — J3089 Other allergic rhinitis: Secondary | ICD-10-CM | POA: Diagnosis not present

## 2023-08-01 DIAGNOSIS — J3081 Allergic rhinitis due to animal (cat) (dog) hair and dander: Secondary | ICD-10-CM | POA: Diagnosis not present

## 2023-08-01 DIAGNOSIS — J301 Allergic rhinitis due to pollen: Secondary | ICD-10-CM | POA: Diagnosis not present

## 2023-08-15 DIAGNOSIS — J301 Allergic rhinitis due to pollen: Secondary | ICD-10-CM | POA: Diagnosis not present

## 2023-08-15 DIAGNOSIS — J3089 Other allergic rhinitis: Secondary | ICD-10-CM | POA: Diagnosis not present

## 2023-08-15 DIAGNOSIS — G4733 Obstructive sleep apnea (adult) (pediatric): Secondary | ICD-10-CM | POA: Diagnosis not present

## 2023-08-15 DIAGNOSIS — Z0000 Encounter for general adult medical examination without abnormal findings: Secondary | ICD-10-CM | POA: Diagnosis not present

## 2023-08-15 DIAGNOSIS — J3081 Allergic rhinitis due to animal (cat) (dog) hair and dander: Secondary | ICD-10-CM | POA: Diagnosis not present

## 2023-08-15 DIAGNOSIS — E782 Mixed hyperlipidemia: Secondary | ICD-10-CM | POA: Diagnosis not present

## 2023-08-15 DIAGNOSIS — Z23 Encounter for immunization: Secondary | ICD-10-CM | POA: Diagnosis not present

## 2023-08-15 DIAGNOSIS — Z113 Encounter for screening for infections with a predominantly sexual mode of transmission: Secondary | ICD-10-CM | POA: Diagnosis not present

## 2023-08-15 DIAGNOSIS — Z125 Encounter for screening for malignant neoplasm of prostate: Secondary | ICD-10-CM | POA: Diagnosis not present

## 2023-08-23 DIAGNOSIS — J3089 Other allergic rhinitis: Secondary | ICD-10-CM | POA: Diagnosis not present

## 2023-08-23 DIAGNOSIS — H1045 Other chronic allergic conjunctivitis: Secondary | ICD-10-CM | POA: Diagnosis not present

## 2023-08-23 DIAGNOSIS — J3081 Allergic rhinitis due to animal (cat) (dog) hair and dander: Secondary | ICD-10-CM | POA: Diagnosis not present

## 2023-08-23 DIAGNOSIS — J301 Allergic rhinitis due to pollen: Secondary | ICD-10-CM | POA: Diagnosis not present

## 2023-08-23 DIAGNOSIS — R052 Subacute cough: Secondary | ICD-10-CM | POA: Diagnosis not present

## 2023-09-28 DIAGNOSIS — J3081 Allergic rhinitis due to animal (cat) (dog) hair and dander: Secondary | ICD-10-CM | POA: Diagnosis not present

## 2023-09-28 DIAGNOSIS — J3089 Other allergic rhinitis: Secondary | ICD-10-CM | POA: Diagnosis not present

## 2023-09-28 DIAGNOSIS — J301 Allergic rhinitis due to pollen: Secondary | ICD-10-CM | POA: Diagnosis not present

## 2023-10-12 DIAGNOSIS — J301 Allergic rhinitis due to pollen: Secondary | ICD-10-CM | POA: Diagnosis not present

## 2023-10-13 DIAGNOSIS — J3089 Other allergic rhinitis: Secondary | ICD-10-CM | POA: Diagnosis not present

## 2023-10-13 DIAGNOSIS — J3081 Allergic rhinitis due to animal (cat) (dog) hair and dander: Secondary | ICD-10-CM | POA: Diagnosis not present

## 2023-10-21 DIAGNOSIS — E6609 Other obesity due to excess calories: Secondary | ICD-10-CM | POA: Diagnosis not present

## 2023-10-21 DIAGNOSIS — Z6839 Body mass index (BMI) 39.0-39.9, adult: Secondary | ICD-10-CM | POA: Diagnosis not present

## 2023-10-21 DIAGNOSIS — R197 Diarrhea, unspecified: Secondary | ICD-10-CM | POA: Diagnosis not present

## 2023-10-21 DIAGNOSIS — R110 Nausea: Secondary | ICD-10-CM | POA: Diagnosis not present

## 2023-10-21 DIAGNOSIS — K529 Noninfective gastroenteritis and colitis, unspecified: Secondary | ICD-10-CM | POA: Diagnosis not present

## 2023-10-21 DIAGNOSIS — G4730 Sleep apnea, unspecified: Secondary | ICD-10-CM | POA: Diagnosis not present

## 2023-11-14 DIAGNOSIS — K621 Rectal polyp: Secondary | ICD-10-CM | POA: Diagnosis not present

## 2023-11-14 DIAGNOSIS — Z1211 Encounter for screening for malignant neoplasm of colon: Secondary | ICD-10-CM | POA: Diagnosis not present

## 2023-11-15 DIAGNOSIS — M109 Gout, unspecified: Secondary | ICD-10-CM | POA: Diagnosis not present

## 2023-11-15 DIAGNOSIS — Z23 Encounter for immunization: Secondary | ICD-10-CM | POA: Diagnosis not present

## 2023-11-29 DIAGNOSIS — J301 Allergic rhinitis due to pollen: Secondary | ICD-10-CM | POA: Diagnosis not present

## 2023-11-29 DIAGNOSIS — J3089 Other allergic rhinitis: Secondary | ICD-10-CM | POA: Diagnosis not present

## 2023-11-29 DIAGNOSIS — J3081 Allergic rhinitis due to animal (cat) (dog) hair and dander: Secondary | ICD-10-CM | POA: Diagnosis not present

## 2023-12-08 DIAGNOSIS — J3089 Other allergic rhinitis: Secondary | ICD-10-CM | POA: Diagnosis not present

## 2023-12-08 DIAGNOSIS — J301 Allergic rhinitis due to pollen: Secondary | ICD-10-CM | POA: Diagnosis not present

## 2023-12-08 DIAGNOSIS — J3081 Allergic rhinitis due to animal (cat) (dog) hair and dander: Secondary | ICD-10-CM | POA: Diagnosis not present

## 2023-12-11 DIAGNOSIS — M25572 Pain in left ankle and joints of left foot: Secondary | ICD-10-CM | POA: Diagnosis not present

## 2023-12-28 DIAGNOSIS — M79675 Pain in left toe(s): Secondary | ICD-10-CM | POA: Diagnosis not present

## 2024-01-23 DIAGNOSIS — S82832D Other fracture of upper and lower end of left fibula, subsequent encounter for closed fracture with routine healing: Secondary | ICD-10-CM | POA: Diagnosis not present

## 2024-01-24 DIAGNOSIS — J301 Allergic rhinitis due to pollen: Secondary | ICD-10-CM | POA: Diagnosis not present

## 2024-01-24 DIAGNOSIS — J3089 Other allergic rhinitis: Secondary | ICD-10-CM | POA: Diagnosis not present

## 2024-01-24 DIAGNOSIS — J3081 Allergic rhinitis due to animal (cat) (dog) hair and dander: Secondary | ICD-10-CM | POA: Diagnosis not present

## 2024-01-26 DIAGNOSIS — J3081 Allergic rhinitis due to animal (cat) (dog) hair and dander: Secondary | ICD-10-CM | POA: Diagnosis not present

## 2024-01-26 DIAGNOSIS — J301 Allergic rhinitis due to pollen: Secondary | ICD-10-CM | POA: Diagnosis not present

## 2024-01-26 DIAGNOSIS — J3089 Other allergic rhinitis: Secondary | ICD-10-CM | POA: Diagnosis not present

## 2024-01-31 DIAGNOSIS — J3089 Other allergic rhinitis: Secondary | ICD-10-CM | POA: Diagnosis not present

## 2024-01-31 DIAGNOSIS — J301 Allergic rhinitis due to pollen: Secondary | ICD-10-CM | POA: Diagnosis not present

## 2024-01-31 DIAGNOSIS — J3081 Allergic rhinitis due to animal (cat) (dog) hair and dander: Secondary | ICD-10-CM | POA: Diagnosis not present

## 2024-02-15 DIAGNOSIS — E66813 Obesity, class 3: Secondary | ICD-10-CM | POA: Diagnosis not present

## 2024-02-15 DIAGNOSIS — Z713 Dietary counseling and surveillance: Secondary | ICD-10-CM | POA: Diagnosis not present

## 2024-02-15 DIAGNOSIS — G4733 Obstructive sleep apnea (adult) (pediatric): Secondary | ICD-10-CM | POA: Diagnosis not present

## 2024-02-15 DIAGNOSIS — Z6841 Body Mass Index (BMI) 40.0 and over, adult: Secondary | ICD-10-CM | POA: Diagnosis not present

## 2024-02-15 DIAGNOSIS — Z23 Encounter for immunization: Secondary | ICD-10-CM | POA: Diagnosis not present

## 2024-02-15 DIAGNOSIS — Z79899 Other long term (current) drug therapy: Secondary | ICD-10-CM | POA: Diagnosis not present
